# Patient Record
Sex: Male | Born: 1960 | Race: Black or African American | Hispanic: No | Marital: Single | Smoking: Former smoker
Health system: Southern US, Community
[De-identification: ages and names within clinical notes are randomized; demographics above are authoritative.]

## PROBLEM LIST (undated history)

## (undated) DIAGNOSIS — E785 Hyperlipidemia, unspecified: Secondary | ICD-10-CM

## (undated) DIAGNOSIS — I10 Essential (primary) hypertension: Secondary | ICD-10-CM

## (undated) HISTORY — PX: SHOULDER ARTHROSCOPY: SHX128

## (undated) HISTORY — PX: ABDOMINAL SURGERY: SHX537

---

## 2006-08-31 ENCOUNTER — Emergency Department (HOSPITAL_COMMUNITY): Admission: EM | Admit: 2006-08-31 | Discharge: 2006-08-31 | Payer: Self-pay | Admitting: Emergency Medicine

## 2008-06-17 ENCOUNTER — Emergency Department: Payer: Self-pay | Admitting: Emergency Medicine

## 2008-10-06 ENCOUNTER — Emergency Department: Payer: Self-pay | Admitting: Unknown Physician Specialty

## 2010-12-06 ENCOUNTER — Emergency Department: Payer: Self-pay | Admitting: Emergency Medicine

## 2012-07-26 ENCOUNTER — Emergency Department: Payer: Self-pay | Admitting: Emergency Medicine

## 2012-12-24 ENCOUNTER — Emergency Department: Payer: Self-pay | Admitting: Emergency Medicine

## 2012-12-26 ENCOUNTER — Emergency Department: Payer: Self-pay | Admitting: Emergency Medicine

## 2013-02-24 ENCOUNTER — Emergency Department: Payer: Self-pay

## 2014-01-22 ENCOUNTER — Emergency Department: Payer: Self-pay | Admitting: Internal Medicine

## 2014-01-22 LAB — BASIC METABOLIC PANEL
ANION GAP: 6 — AB (ref 7–16)
BUN: 17 mg/dL (ref 7–18)
CHLORIDE: 103 mmol/L (ref 98–107)
CO2: 28 mmol/L (ref 21–32)
Calcium, Total: 9.1 mg/dL (ref 8.5–10.1)
Creatinine: 0.99 mg/dL (ref 0.60–1.30)
EGFR (African American): >60
Glucose: 92 mg/dL (ref 65–99)
OSMOLALITY: 275 (ref 275–301)
Potassium: 4.1 mmol/L (ref 3.5–5.1)
Sodium: 137 mmol/L (ref 136–145)

## 2014-01-22 LAB — URINALYSIS, COMPLETE
BILIRUBIN, UR: NEGATIVE
Bacteria: NONE SEEN
Blood: NEGATIVE
Glucose,UR: NEGATIVE mg/dL (ref 0–75)
KETONE: NEGATIVE
LEUKOCYTE ESTERASE: NEGATIVE
NITRITE: NEGATIVE
PH: 5 (ref 4.5–8.0)
PROTEIN: NEGATIVE
RBC,UR: <1 /HPF (ref 0–5)
Specific Gravity: 1.017 (ref 1.003–1.030)
Squamous Epithelial: <1

## 2014-01-22 LAB — CBC WITH DIFFERENTIAL/PLATELET
Basophil #: 0.1 10*3/uL (ref 0.0–0.1)
Basophil %: 0.7 %
Eosinophil #: 0.2 10*3/uL (ref 0.0–0.7)
Eosinophil %: 1.7 %
HCT: 46.4 % (ref 40.0–52.0)
HGB: 15.1 g/dL (ref 13.0–18.0)
LYMPHS PCT: 21.5 %
Lymphocyte #: 2.3 10*3/uL (ref 1.0–3.6)
MCH: 30 pg (ref 26.0–34.0)
MCHC: 32.5 g/dL (ref 32.0–36.0)
MCV: 92 fL (ref 80–100)
MONO ABS: 0.7 x10 3/mm (ref 0.2–1.0)
MONOS PCT: 6.4 %
NEUTROS ABS: 7.6 10*3/uL — AB (ref 1.4–6.5)
NEUTROS PCT: 69.7 %
Platelet: 225 10*3/uL (ref 150–440)
RBC: 5.02 10*6/uL (ref 4.40–5.90)
RDW: 14.1 % (ref 11.5–14.5)
WBC: 10.9 10*3/uL — ABNORMAL HIGH (ref 3.8–10.6)

## 2014-01-22 LAB — TROPONIN I: Troponin-I: <0.02 ng/mL

## 2015-08-11 IMAGING — CT CT HEAD WITHOUT CONTRAST
1 series · 16 of 30 positions shown, 20 images · non-contrast
Comparison: None.

CLINICAL DATA: Headache for 1-1/2 weeks, high blood pressure,
tingling in the right arm

EXAM:
CT HEAD WITHOUT CONTRAST
TECHNIQUE: Contiguous axial images were obtained from the base of the skull
through the vertex without intravenous contrast.

[Series 2: head wo · axial · 0.41mm/px · z∈[+405,+535]mm · 16 of 32 slices shown, 20 images]
[im 2/32  brain]
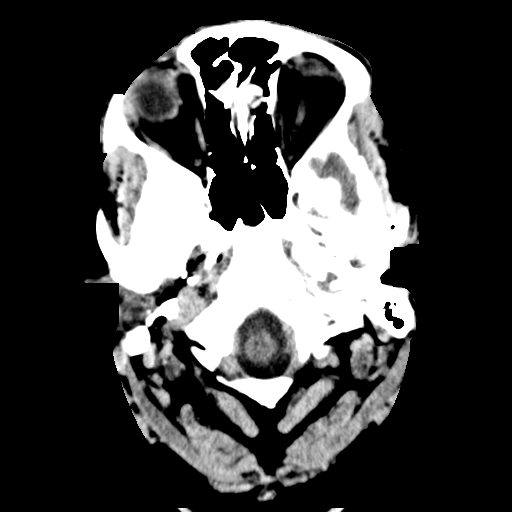
[im 2/32  bone]
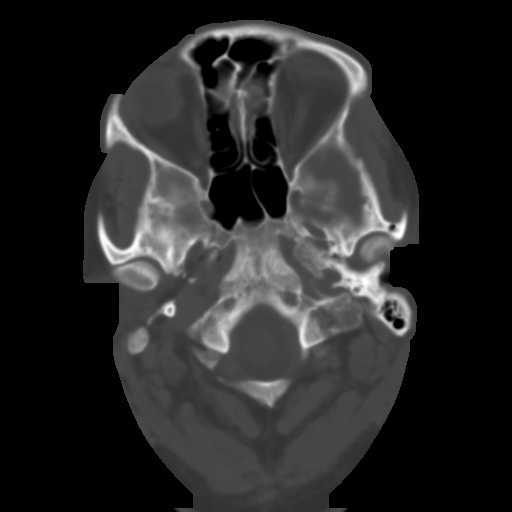
[im 4/32  brain]
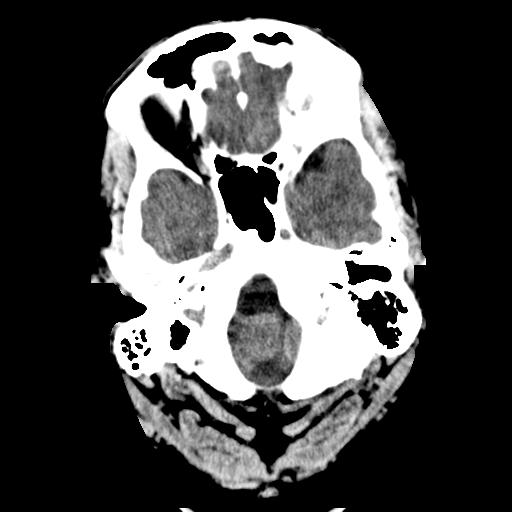
[im 6/32  brain]
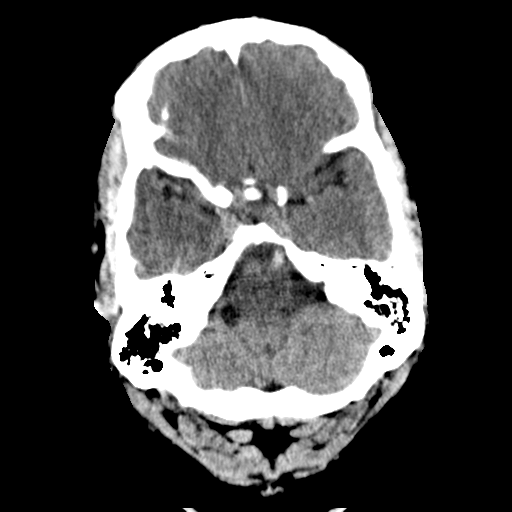
[im 8/32  brain]
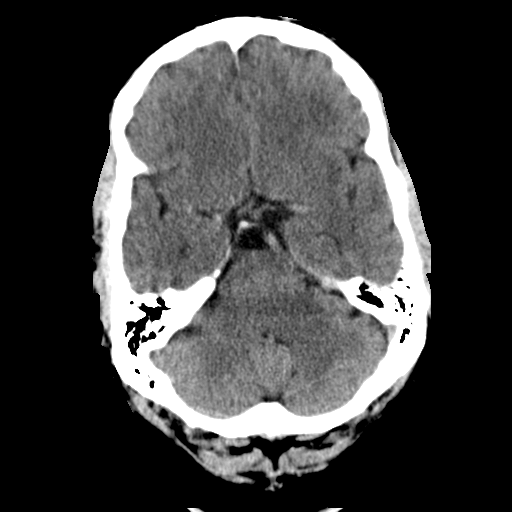
[im 9/32  brain]
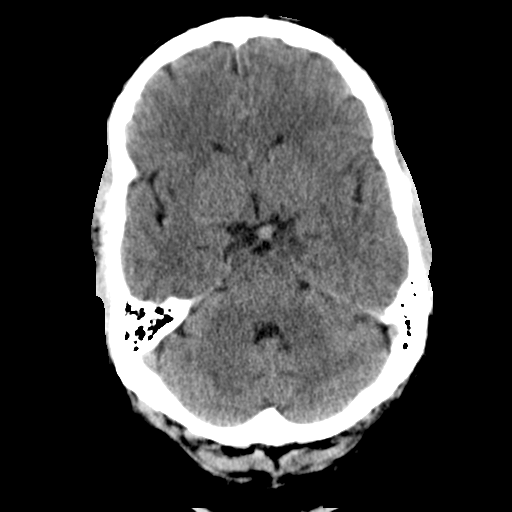
[im 9/32  bone]
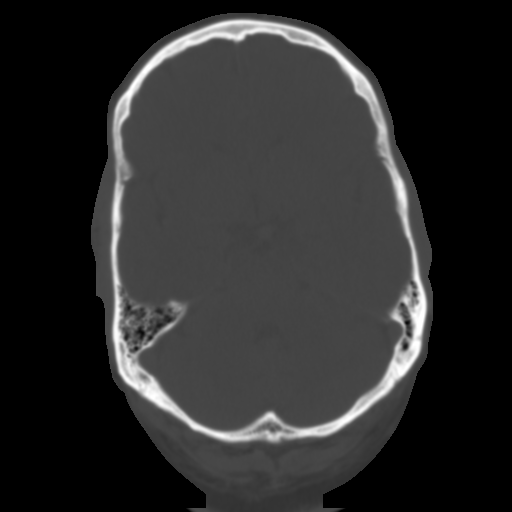
[im 11/32  brain]
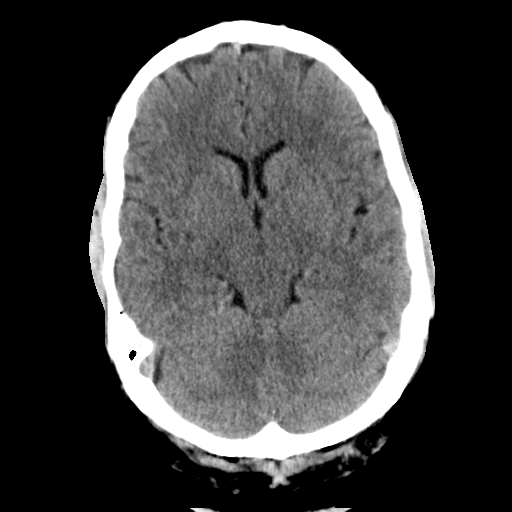
[im 13/32  brain]
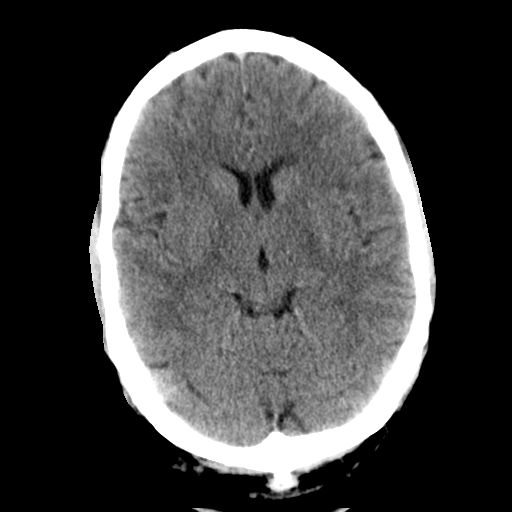
[im 15/32  brain]
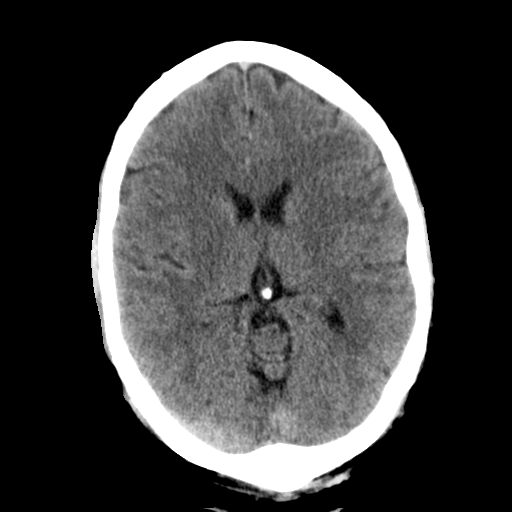
[im 17/32  brain]
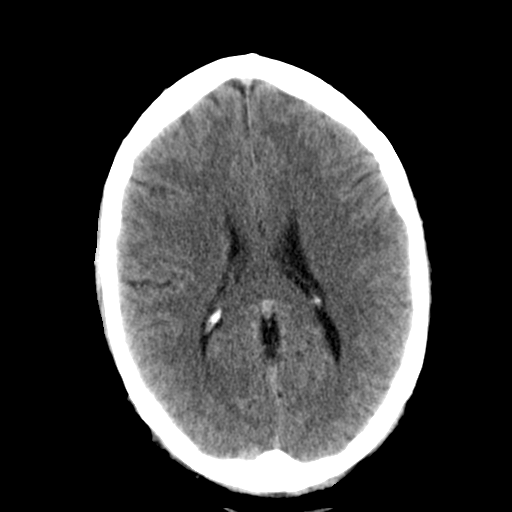
[im 17/32  bone]
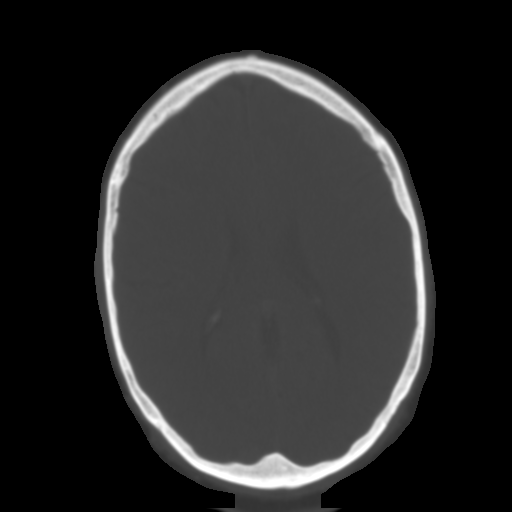
[im 19/32  brain]
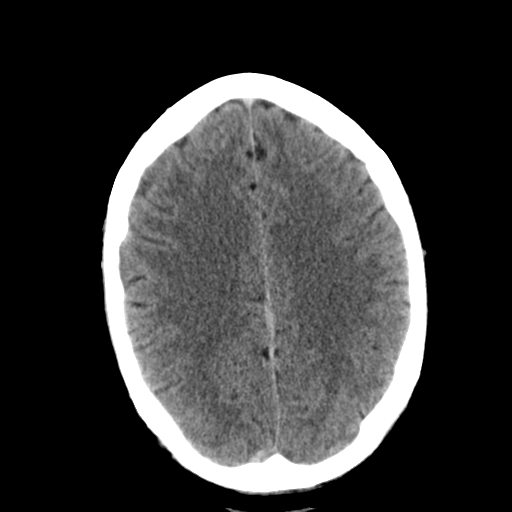
[im 21/32  brain]
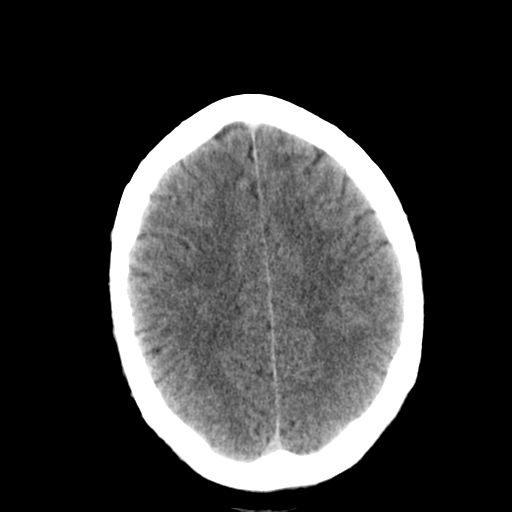
[im 23/32  brain]
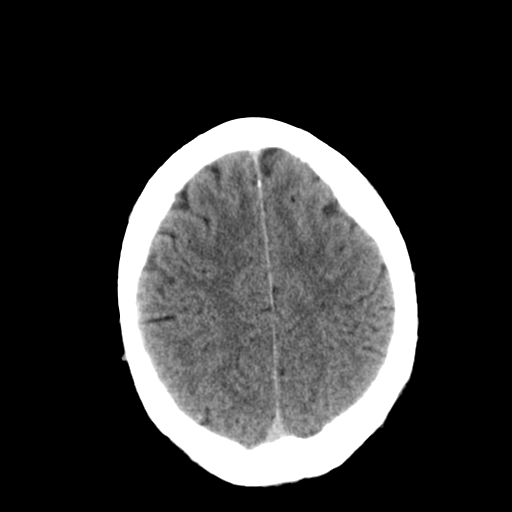
[im 24/32  brain]
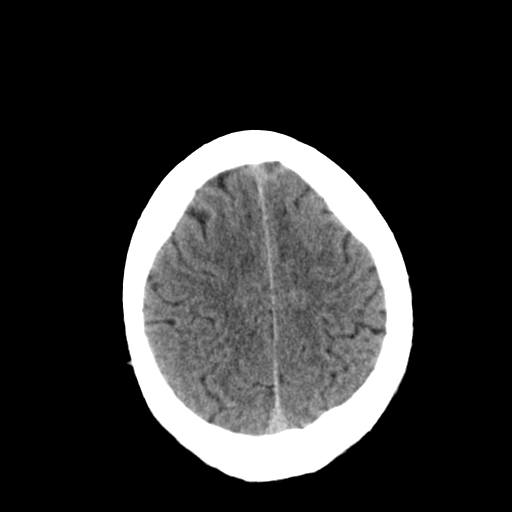
[im 24/32  bone]
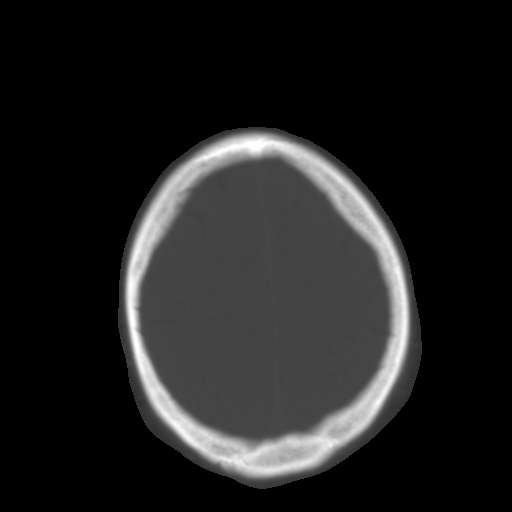
[im 26/32  brain]
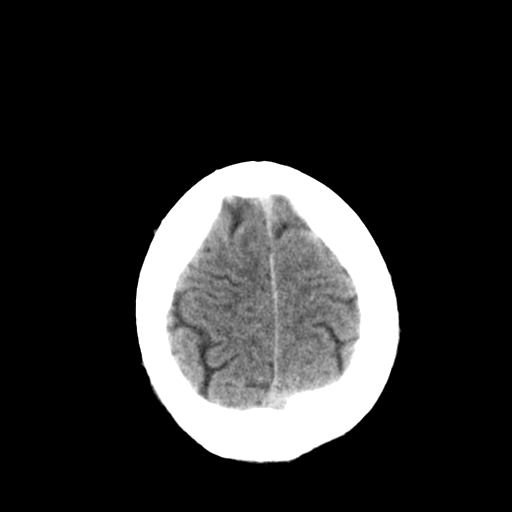
[im 28/32  brain]
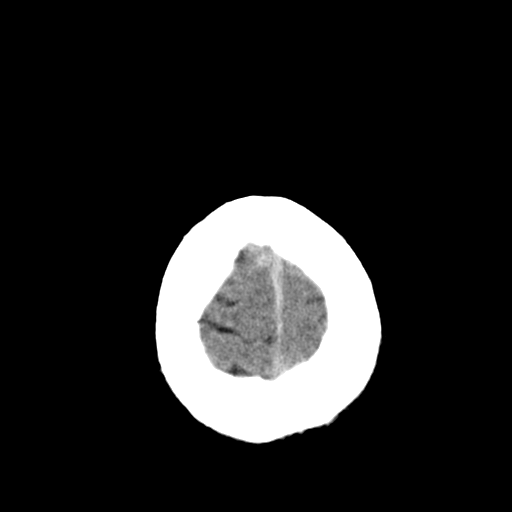
[im 30/32  brain]
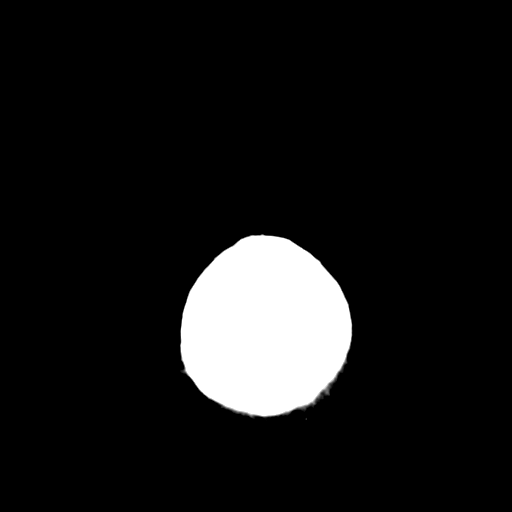

[16 of 30 positions shown; findings below may reference images not displayed]

FINDINGS: The ventricular system is normal in size and configuration, and the
septum is in a normal midline position. The fourth ventricle and
basilar cisterns are unremarkable. No hemorrhage, mass lesion, or
acute infarction is seen. On bone window images, no calvarial
abnormality is noted. The paranasal sinuses that are visualized are
clear.
IMPRESSION: Negative unenhanced CT of the brain.

## 2015-12-13 ENCOUNTER — Emergency Department
Admission: EM | Admit: 2015-12-13 | Discharge: 2015-12-13 | Disposition: A | Discharge status: Home / Self Care | Payer: Self-pay | Attending: Emergency Medicine | Admitting: Emergency Medicine

## 2015-12-13 ENCOUNTER — Encounter: Payer: Self-pay | Admitting: Emergency Medicine

## 2015-12-13 ENCOUNTER — Emergency Department: Payer: Self-pay

## 2015-12-13 DIAGNOSIS — Y9389 Activity, other specified: Secondary | ICD-10-CM | POA: Insufficient documentation

## 2015-12-13 DIAGNOSIS — Y929 Unspecified place or not applicable: Secondary | ICD-10-CM | POA: Insufficient documentation

## 2015-12-13 DIAGNOSIS — X500XXA Overexertion from strenuous movement or load, initial encounter: Secondary | ICD-10-CM | POA: Insufficient documentation

## 2015-12-13 DIAGNOSIS — S838X1A Sprain of other specified parts of right knee, initial encounter: Secondary | ICD-10-CM

## 2015-12-13 DIAGNOSIS — I10 Essential (primary) hypertension: Secondary | ICD-10-CM | POA: Insufficient documentation

## 2015-12-13 DIAGNOSIS — S83241A Other tear of medial meniscus, current injury, right knee, initial encounter: Secondary | ICD-10-CM | POA: Insufficient documentation

## 2015-12-13 DIAGNOSIS — Z87891 Personal history of nicotine dependence: Secondary | ICD-10-CM | POA: Insufficient documentation

## 2015-12-13 DIAGNOSIS — Y990 Civilian activity done for income or pay: Secondary | ICD-10-CM | POA: Insufficient documentation

## 2015-12-13 HISTORY — DX: Essential (primary) hypertension: I10

## 2015-12-13 MED ORDER — MELOXICAM 15 MG PO TABS: 15.0000 mg | ORAL_TABLET | Freq: Every day | ORAL | 0 refills | Status: DC

## 2015-12-13 MED ORDER — HYDROCODONE-ACETAMINOPHEN 5-325 MG PO TABS: 1.0000 | ORAL_TABLET | ORAL | 0 refills | Status: AC | PRN

## 2015-12-13 NOTE — ED Triage Notes (Addendum)
Pt comes in c/o right knee pain to the medial side of the knee. Reports that he has had pain in this knee for several months but has never been seen for it before. Reports that the pain is sharp in nature and radiates up and down the leg. Reports that the pain has gotten worse over the last several weeks and made it hard to sleep last night. Reports that at times it has given out on him when standing or walking. Denies ever having injured that knee either recently or in the past.

## 2015-12-13 NOTE — ED Provider Notes (Signed)
[facility] Emergency Department Provider Note ____________________________________________  Time seen: Approximately 6:51 PM  I have reviewed the triage vital signs and the nursing notes.   HISTORY  Chief Complaint Knee Pain    HPI Vincent SCAFF Sr. is a 55 y.o. male who presents to the emergency department for evaluation of knee pain. No recent or history of knee injury. 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 He states that the pain has been present for approximately one month, but has been getting worse over the past week or so. Pain was intensified last night and he was able to get much rest. He has not been taking any medications for this pain.  Past Medical History:  Diagnosis Date  . Hypertension     There are no active problems to display for this patient.   Past Surgical History:  Procedure Laterality Date  . SHOULDER ARTHROSCOPY Left     Prior to Admission medications   Medication Sig Start Date End Date Taking? Authorizing Provider  HYDROcodone-acetaminophen (NORCO/VICODIN) 5-325 MG tablet Take 1 tablet by mouth every 4 (four) hours as needed for moderate pain. 12/13/15 12/12/16  Victorino Dike, FNP  meloxicam (MOBIC) 15 MG tablet Take 1 tablet (15 mg total) by mouth daily. 12/13/15   Victorino Dike, FNP    Allergies Patient has no known allergies.  No family history on file.  Social History Social History  Substance Use Topics  . Smoking status: Former Research scientist (life sciences)  . Smokeless tobacco: Not on file  . Alcohol use No    Review of Systems Constitutional: No recent illness. Cardiovascular: Denies chest pain or palpitations. Respiratory: Denies shortness of breath. Musculoskeletal: Pain in Right knee. Negative for calf pain. Skin: Negative for rash, wound, lesion. Neurological: Negative for focal weakness or numbness.  ____________________________________________   PHYSICAL  EXAM:  VITAL SIGNS: ED Triage Vitals  Enc Vitals Group     BP 12/13/15 1825 (!) 143/99     Pulse Rate 12/13/15 1825 90     Resp 12/13/15 1825 18     Temp 12/13/15 1825 98.7 F (37.1 C)     Temp Source 12/13/15 1825 Oral     SpO2 12/13/15 1825 99 %     Weight 12/13/15 1826 240 lb (108.9 kg)     Height 12/13/15 1826 6\' 2"  (1.88 m)     Head Circumference --      Peak Flow --      Pain Score 12/13/15 1838 9     Pain Loc --      Pain Edu? --      Excl. in [place]? --     Constitutional: Alert and oriented. Well appearing and in no acute distress. Eyes: Conjunctivae are normal. EOMI. Head: Atraumatic. Neck: No stridor.  Respiratory: Normal respiratory effort.   Musculoskeletal: Pain increases with valgus stress. 5+ strength against resistance. Pain with McMurray's test, but no click or clunk felt on exam.  Neurologic:  Normal speech and language. No gross focal neurologic deficits are appreciated. Speech is normal. No gait instability. Skin:  Skin is warm, dry and intact. Atraumatic. Psychiatric: Mood and affect are normal. Speech and behavior are normal.  ____________________________________________   LABS (all labs ordered are listed, but only abnormal results are displayed)  Labs Reviewed - No data to display ____________________________________________  RADIOLOGY  Right knee negative for acute bony abnormality. ____________________________________________   PROCEDURES  Procedure(s) performed: Knee immobilizer applied by ER tech.  ____________________________________________   INITIAL IMPRESSION / ASSESSMENT AND PLAN / ED COURSE  Clinical Course     Pertinent labs & imaging results that were available during my care of the patient were reviewed by me and considered in my medical decision making (see chart for details).  Patient was advised to rest, ice, and elevate the right lower extremity as often as possible. He was advised To follow-up with the orthopedist  for symptoms that aren't improving over the week. Advised to return to emergency department for symptoms change or worsen if unable schedule an appointment. ____________________________________________   FINAL CLINICAL IMPRESSION(S) / ED DIAGNOSES  Final diagnoses:  Acute meniscal injury of knee, right, initial encounter       Victorino Dike, FNP 12/13/15 2236    Carrie Mew, MD 12/22/15 [phone]

## 2015-12-13 NOTE — ED Notes (Signed)
NAD noted at time of D/C. Pt denies questions or concerns. Pt ambulatory to the lobby at this time. Pt refused wheelchair to the lobby.  

## 2016-06-06 ENCOUNTER — Encounter: Payer: Self-pay | Admitting: *Deleted

## 2016-06-06 ENCOUNTER — Emergency Department: Payer: Self-pay

## 2016-06-06 ENCOUNTER — Emergency Department
Admission: EM | Admit: 2016-06-06 | Discharge: 2016-06-06 | Disposition: A | Discharge status: Home / Self Care | Payer: Self-pay | Attending: Emergency Medicine | Admitting: Emergency Medicine

## 2016-06-06 DIAGNOSIS — R0789 Other chest pain: Secondary | ICD-10-CM | POA: Insufficient documentation

## 2016-06-06 DIAGNOSIS — Z87891 Personal history of nicotine dependence: Secondary | ICD-10-CM | POA: Insufficient documentation

## 2016-06-06 DIAGNOSIS — R079 Chest pain, unspecified: Secondary | ICD-10-CM

## 2016-06-06 DIAGNOSIS — I10 Essential (primary) hypertension: Secondary | ICD-10-CM | POA: Insufficient documentation

## 2016-06-06 DIAGNOSIS — R600 Localized edema: Secondary | ICD-10-CM | POA: Insufficient documentation

## 2016-06-06 DIAGNOSIS — Z79899 Other long term (current) drug therapy: Secondary | ICD-10-CM | POA: Insufficient documentation

## 2016-06-06 LAB — TROPONIN I: Troponin I: <0.03 ng/mL (ref <0.03)

## 2016-06-06 LAB — BASIC METABOLIC PANEL
ANION GAP: 10 (ref 5–15)
BUN: 20 mg/dL (ref 6–20)
CHLORIDE: 103 mmol/L (ref 101–111)
CO2: 26 mmol/L (ref 22–32)
CREATININE: 1.06 mg/dL (ref 0.61–1.24)
Calcium: 8.9 mg/dL (ref 8.9–10.3)
GFR calc non Af Amer: >60 mL/min (ref >60)
Glucose, Bld: 103 mg/dL — ABNORMAL HIGH (ref 65–99)
POTASSIUM: 4.2 mmol/L (ref 3.5–5.1)
SODIUM: 139 mmol/L (ref 135–145)

## 2016-06-06 LAB — CBC
HEMATOCRIT: 41.6 % (ref 40.0–52.0)
HEMOGLOBIN: 14 g/dL (ref 13.0–18.0)
MCH: 30.1 pg (ref 26.0–34.0)
MCHC: 33.7 g/dL (ref 32.0–36.0)
MCV: 89.3 fL (ref 80.0–100.0)
PLATELETS: 221 10*3/uL (ref 150–440)
RBC: 4.66 MIL/uL (ref 4.40–5.90)
RDW: 14.1 % (ref 11.5–14.5)
WBC: 9.8 10*3/uL (ref 3.8–10.6)

## 2016-06-06 LAB — BRAIN NATRIURETIC PEPTIDE: B Natriuretic Peptide: 24 pg/mL (ref 0.0–100.0)

## 2016-06-06 MED ORDER — FUROSEMIDE 40 MG PO TABS: 40.0000 mg | ORAL_TABLET | Freq: Every day | ORAL | 0 refills | Status: AC

## 2016-06-06 NOTE — ED Triage Notes (Signed)
Pt arrived to ED reporting bilateral leg swelling x 1 month that worsened last night. Pt reports legs were numb last night as well. Left sided chest pain reported to have began 1 week ago. No radiation. No dizziness or lightheaded but pt reports increased diaphoresis while sleeping. PT reports also having SOB but reports "I have always been short of breath."   No hx of CHF.

## 2016-06-06 NOTE — ED Provider Notes (Signed)
[facility] Emergency Department Provider Note  ____________________________________________  Time seen: Approximately 4:30 PM  I have reviewed the triage vital signs and the nursing notes.   HISTORY  Chief Complaint Chest Pain and Leg Swelling    HPI Vincent LOW Sr. is a 56 y.o. male with a history of hypertension presenting with bilateral lower extremity swelling and chest pain. The patient reports that yesterday he was standing all day, and then noticed that he had bilateral symmetric swelling of the legs without any calf pain. Over the past 2 weeks, he has had intermittent episodes where he develops a sharp chest pain which lasts just a couple of seconds and goes away on its own. It is not pleuritic, does not radiate, it is not associated with shortness of breath, diaphoresis, nausea or vomiting, or palpitations. No lightheadedness or syncope. He has not had any chest pain over the past few days.    Past Medical History:  Diagnosis Date  . Hypertension     There are no active problems to display for this patient.   Past Surgical History:  Procedure Laterality Date  . SHOULDER ARTHROSCOPY Left     Current Outpatient Rx  . Order #: [id number] Class: Print  . Order #: [id number] Class: Print  . Order #: [id number] Class: Print    Allergies Patient has no known allergies.  History reviewed. No pertinent family history.  Social History Social History  Substance Use Topics  . Smoking status: Former Research scientist (life sciences)  . Smokeless tobacco: Never Used  . Alcohol use No    Review of Systems Constitutional: No fever/chills.No lightheadedness or syncope. Eyes: No visual changes. ENT: No sore throat. No congestion or rhinorrhea. Cardiovascular: Positive chest pain. Denies palpitations. Respiratory: Denies shortness of breath.  No cough. Gastrointestinal: No abdominal pain.  No nausea, no vomiting.  No diarrhea.  No constipation. Genitourinary: Negative for  dysuria. Musculoskeletal: Negative for back pain. Positive bilateral lower extremity swelling. No calf pain. Skin: Negative for rash. Neurological: Negative for headaches. No focal numbness, tingling or weakness.   10-point ROS otherwise negative.  ____________________________________________   PHYSICAL EXAM:  VITAL SIGNS: ED Triage Vitals  Enc Vitals Group     BP 06/06/16 1110 (!) 147/89     Pulse Rate 06/06/16 1110 73     Resp 06/06/16 1110 16     Temp 06/06/16 1110 98.5 F (36.9 C)     Temp Source 06/06/16 1110 Oral     SpO2 06/06/16 1110 98 %     Weight 06/06/16 1110 240 lb (108.9 kg)     Height 06/06/16 1110 6' 2.5" (1.892 m)     Head Circumference --      Peak Flow --      Pain Score 06/06/16 1109 7     Pain Loc --      Pain Edu? --      Excl. in [place]? --     Constitutional: Alert and oriented. Well appearing and in no acute distress. Answers questions appropriately. Eyes: Conjunctivae are normal.  EOMI. No scleral icterus. Head: Atraumatic. Nose: No congestion/rhinnorhea. Mouth/Throat: Mucous membranes are moist.  Neck: No stridor.  Supple.  No JVD. No meningismus. Cardiovascular: Normal rate, regular rhythm. No murmurs, rubs or gallops.  Respiratory: Normal respiratory effort.  No accessory muscle use or retractions. Lungs CTAB.  No wheezes, rales or ronchi. Gastrointestinal: Soft, nontender and nondistended.  No guarding or rebound.  No peritoneal signs. Musculoskeletal: Mild pitting symmetric LE edema to the distal  third of the tibia, without overlying erythema or warmth. No ttp in the calves or palpable cords.  Negative Homan's sign. Neurologic:  A&Ox3.  Speech is clear.  Face and smile are symmetric.  EOMI.  Moves all extremities well. Skin:  Skin is warm, dry and intact. No rash noted. Psychiatric: Mood and affect are normal. Speech and behavior are normal.  Normal judgement  ____________________________________________   LABS (all labs ordered are listed,  but only abnormal results are displayed)  Labs Reviewed  BASIC METABOLIC PANEL - Abnormal; Notable for the following:       Result Value   Glucose, Bld 103 (*)    All other components within normal limits  CBC  TROPONIN I  BRAIN NATRIURETIC PEPTIDE  TROPONIN I   ____________________________________________  EKG  ED ECG REPORT I, Eula Listen, the attending physician, personally viewed and interpreted this ECG.   Date: 06/06/2016  EKG Time: 1111  Rate: 72  Rhythm: normal sinus rhythm  Axis: normal  Intervals:none  ST&T Change: Nonspecific T-wave inversions in V1. No ST elevation  ____________________________________________  RADIOLOGY  Dg Chest 2 View  Result Date: 06/06/2016 CLINICAL DATA:  Chest pain EXAM: CHEST  2 VIEW COMPARISON:  01/22/2014 FINDINGS: Normal heart size. No pleural effusion or edema. No airspace opacities. The visualized osseous structures are unremarkable. IMPRESSION: 1. No acute cardiopulmonary abnormalities. Electronically Signed   By: Kerby Moors M.D.   On: 06/06/2016 11:33    ____________________________________________   PROCEDURES  Procedure(s) performed: None  Procedures  Critical Care performed: No ____________________________________________   INITIAL IMPRESSION / ASSESSMENT AND PLAN / ED COURSE  Pertinent labs & imaging results that were available during my care of the patient were reviewed by me and considered in my medical decision making (see chart for details).  56 y.o. male with a history of hypertension presenting with bilateral lower extremity edema after standing all day, and 2 weeks of intermittent chest pain episodes. Overall, the patient is well-appearing and has stable vital signs. He is not having any chest pain at this time. It is likely that his lower extremity edema is from age-related venous insufficiency. His creatinine is normal today, and his BNP is 24, so CHF is much less likely. He has no risk  factors for DVT, and it would be very unlikely to develop bilateral DVTs although I have given him return precautions for this, and he knows that after treatment for peripheral edema, he needs to follow up with his primary care physician for further evaluation including ultrasound if his symptoms do not resolve. For his chest pain, the patient has atypical pain with an EKG that does not show ischemic changes and multiple troponins here that are negative, and he is asymptomatic at this time. No further workup is warranted in the emergency department but I have given him the name for the cardiologist on-call to set up an outpatient stress test for risk stratification. At this time, the patient is stable for discharge. We discussed return precautions as well as follow-up instructions.  ____________________________________________  FINAL CLINICAL IMPRESSION(S) / ED DIAGNOSES  Final diagnoses:  Bilateral lower extremity edema  Chest pain, unspecified type         NEW MEDICATIONS STARTED DURING THIS VISIT:  New Prescriptions   FUROSEMIDE (LASIX) 40 MG TABLET    Take 1 tablet (40 mg total) by mouth daily.      Eula Listen, MD 06/06/16 [phone]

## 2016-06-06 NOTE — Discharge Instructions (Signed)
Please take Lasix for the next 3 days, keep your legs elevated above the level of your heart as much as possible, and use compression stockings to prevent swelling in the legs.  Make an appointment with a cardiologist for a stress test and reevaluation of your chest pain.  Return to the emergency department if he developed chest pain, shortness of breath, lightheadedness or fainting, increased swelling, fever, or any other symptoms concerning to you.

## 2016-06-07 ENCOUNTER — Telehealth: Payer: Self-pay

## 2016-06-07 NOTE — Telephone Encounter (Signed)
Tried to call patient they need an ED fu seen on 06/06/16 for CP and swelling  VM was full will try again

## 2016-06-13 NOTE — Telephone Encounter (Signed)
Tried to call patient they need an ED fu seen on 06/06/16 for CP and swelling  VM was full will try again

## 2016-06-14 NOTE — Telephone Encounter (Signed)
Tried to call patient they need an ED fu seen on 06/06/16 for CP and swelling  VM was full will try again

## 2016-06-16 NOTE — Telephone Encounter (Signed)
Sent letter

## 2017-02-13 ENCOUNTER — Other Ambulatory Visit: Payer: Self-pay

## 2017-02-13 ENCOUNTER — Encounter: Payer: Self-pay | Admitting: Gastroenterology

## 2017-02-13 ENCOUNTER — Ambulatory Visit: Payer: Self-pay | Admitting: Gastroenterology

## 2017-02-13 ENCOUNTER — Encounter (INDEPENDENT_AMBULATORY_CARE_PROVIDER_SITE_OTHER): Payer: Self-pay

## 2017-02-13 VITALS — BP 152/92 | HR 83 | Ht 74.5 in | Wt 245.2 lb

## 2017-02-13 DIAGNOSIS — K625 Hemorrhage of anus and rectum: Secondary | ICD-10-CM

## 2017-02-13 DIAGNOSIS — R195 Other fecal abnormalities: Secondary | ICD-10-CM

## 2017-02-13 NOTE — Progress Notes (Signed)
Vonda Antigua [address]  [place]  [place],  [address]  Main: [phone]  Fax: [phone]   Gastroenterology Consultation  Referring Provider:     [facility], [place] Physician:  [facility], [facility] Primary Gastroenterologist:  Dr. Vonda Antigua Reason for Consultation:     Positive FOBT        HPI:   Vincent MILHOUSE Sr. is a 57 y.o. y/o male referred for consultation & management  by Dr. Domingo Madeira, [facility].   Pt. Was found to have a positive FOBT on CRC screening done by primary care provider.  No previous history of colonoscopies.  Reports intermittent bright red blood per rectum for 1 year.  Occurs once every 2-3 months.  Occurs more often when he is straining with a bowel movement.  Reports minimal blood streaks in stool.  Reports history of hemorrhoids one year ago that were treated with suppositories and that resolved the bright red blood per rectum at that time.  No family history of colon cancer.  Patient denies any abdominal pain, diarrhea, altered bowel habits, weight loss.  Denies any dysphagia or nausea vomiting.  No alarm symptoms present.  Takes meloxicam once or twice a month.  Past Medical History:  Diagnosis Date  . Hypertension     Past Surgical History:  Procedure Laterality Date  . SHOULDER ARTHROSCOPY Left     Prior to Admission medications   Medication Sig Start Date End Date Taking? Authorizing Provider  furosemide (LASIX) 40 MG tablet Take 1 tablet (40 mg total) by mouth daily. 06/06/16 06/06/17  Eula Listen, MD  meloxicam (MOBIC) 15 MG tablet Take 1 tablet (15 mg total) by mouth daily. 12/13/15   Triplett, Dessa Phi, FNP    No family history on file.   Social History   Tobacco Use  . Smoking status: Former Research scientist (life sciences)  . Smokeless tobacco: Never Used  Substance Use Topics  . Alcohol use: No  . Drug use: No    Allergies as of 02/13/2017  . (No Known Allergies)    Review  of Systems:    All systems reviewed and negative except where noted in HPI.   Physical Exam:  There were no vitals taken for this visit. No LMP for male patient. Psych:  Alert and cooperative. Normal mood and affect. General:   Alert,  Well-developed, well-nourished, pleasant and cooperative in NAD Head:  Normocephalic and atraumatic. Eyes:  Sclera clear, no icterus.   Conjunctiva pink. Ears:  Normal auditory acuity. Nose:  No deformity, discharge, or lesions. Mouth:  No deformity or lesions,oropharynx pink & moist. Neck:  Supple; no masses or thyromegaly. Lungs:  Respirations even and unlabored.  Clear throughout to auscultation.   No wheezes, crackles, or rhonchi. No acute distress. Heart:  Regular rate and rhythm; no murmurs, clicks, rubs, or gallops. Abdomen:  Normal bowel sounds.  No bruits.  Soft, non-tender and non-distended without masses, hepatosplenomegaly or hernias noted.  No guarding or rebound tenderness.    Msk:  Symmetrical without gross deformities. Good, equal movement & strength bilaterally. Pulses:  Normal pulses noted. Extremities:  No clubbing or edema.  No cyanosis. Neurologic:  Alert and oriented x3;  grossly normal neurologically. Skin:  Intact without significant lesions or rashes. No jaundice. Lymph Nodes:  No significant cervical adenopathy. Psych:  Alert and cooperative. Normal mood and affect.   Labs: CBC    Component Value Date/Time   WBC 9.8 06/06/2016 1109   RBC 4.66  06/06/2016 1109   HGB 14.0 06/06/2016 1109   HGB 15.1 01/22/2014 1056   HCT 41.6 06/06/2016 1109   HCT 46.4 01/22/2014 1056   PLT 221 06/06/2016 1109   PLT 225 01/22/2014 1056   MCV 89.3 06/06/2016 1109   MCV 92 01/22/2014 1056   MCH 30.1 06/06/2016 1109   MCHC 33.7 06/06/2016 1109   RDW 14.1 06/06/2016 1109   RDW 14.1 01/22/2014 1056   LYMPHSABS 2.3 01/22/2014 1056   MONOABS 0.7 01/22/2014 1056   EOSABS 0.2 01/22/2014 1056   BASOSABS 0.1 01/22/2014 1056   CMP       Component Value Date/Time   NA 139 06/06/2016 1109   NA 137 01/22/2014 1056   K 4.2 06/06/2016 1109   K 4.1 01/22/2014 1056   CL 103 06/06/2016 1109   CL 103 01/22/2014 1056   CO2 26 06/06/2016 1109   CO2 28 01/22/2014 1056   GLUCOSE 103 (H) 06/06/2016 1109   GLUCOSE 92 01/22/2014 1056   BUN 20 06/06/2016 1109   BUN 17 01/22/2014 1056   CREATININE 1.06 06/06/2016 1109   CREATININE 0.99 01/22/2014 1056   CALCIUM 8.9 06/06/2016 1109   CALCIUM 9.1 01/22/2014 1056   GFRNONAA >60 06/06/2016 1109   GFRNONAA >60 01/22/2014 1056   GFRAA >60 06/06/2016 1109   GFRAA >60 01/22/2014 1056    Imaging Studies: No results found.  Assessment and Plan:   Vincent TREFRY Sr. is a 57 y.o. y/o male has been referred for positive FOBT found on CRC screening by primary care provider with intermittent bright red blood per rectum, and reported history of hemorrhoids one year ago treated with suppositories.  Patient has never had a colonoscopy We will schedule for colonoscopy due to positive FOBT I have discussed alternative options, risks & benefits,  which include, but are not limited to, bleeding, infection, perforation,respiratory complication & drug reaction.  The patient agrees with this plan & written consent will be obtained.    Intermittent bright red blood per rectum likely due to hemorrhoids, and could have caused FOBT to be positive as well High-fiber diet Maintain soft stool Can evaluate the hemorrhoids at the time of the colonoscopy as well.  Patient denies any pain in the area.  Patient educated about meloxicam use causing peptic ulcer disease.  No clinical signs or symptoms of such at this time.  Patient asked to take the medication with food and use it sparingly, and only as needed.  Dr Vonda Antigua

## 2017-02-14 ENCOUNTER — Other Ambulatory Visit: Payer: Self-pay

## 2017-02-14 DIAGNOSIS — R195 Other fecal abnormalities: Secondary | ICD-10-CM

## 2017-02-23 ENCOUNTER — Encounter
Admission: RE | Disposition: A | Discharge status: Home / Self Care | Payer: Self-pay | Source: Ambulatory Visit | Attending: Gastroenterology

## 2017-02-23 ENCOUNTER — Ambulatory Visit
Admission: RE | Admit: 2017-02-23 | Discharge: 2017-02-23 | Disposition: A | Discharge status: Home / Self Care | Payer: Self-pay | Source: Ambulatory Visit | Attending: Gastroenterology | Admitting: Gastroenterology

## 2017-02-23 ENCOUNTER — Ambulatory Visit: Payer: Self-pay | Admitting: Anesthesiology

## 2017-02-23 ENCOUNTER — Ambulatory Visit: Admit: 2017-02-23 | Payer: Self-pay | Admitting: Gastroenterology

## 2017-02-23 ENCOUNTER — Encounter: Payer: Self-pay | Admitting: *Deleted

## 2017-02-23 DIAGNOSIS — K635 Polyp of colon: Secondary | ICD-10-CM

## 2017-02-23 DIAGNOSIS — Z791 Long term (current) use of non-steroidal anti-inflammatories (NSAID): Secondary | ICD-10-CM | POA: Insufficient documentation

## 2017-02-23 DIAGNOSIS — Z79899 Other long term (current) drug therapy: Secondary | ICD-10-CM | POA: Insufficient documentation

## 2017-02-23 DIAGNOSIS — D120 Benign neoplasm of cecum: Secondary | ICD-10-CM

## 2017-02-23 DIAGNOSIS — R195 Other fecal abnormalities: Secondary | ICD-10-CM

## 2017-02-23 DIAGNOSIS — D125 Benign neoplasm of sigmoid colon: Secondary | ICD-10-CM

## 2017-02-23 DIAGNOSIS — K648 Other hemorrhoids: Secondary | ICD-10-CM | POA: Insufficient documentation

## 2017-02-23 DIAGNOSIS — I10 Essential (primary) hypertension: Secondary | ICD-10-CM | POA: Insufficient documentation

## 2017-02-23 DIAGNOSIS — D374 Neoplasm of uncertain behavior of colon: Secondary | ICD-10-CM | POA: Insufficient documentation

## 2017-02-23 DIAGNOSIS — Z87891 Personal history of nicotine dependence: Secondary | ICD-10-CM | POA: Insufficient documentation

## 2017-02-23 DIAGNOSIS — K921 Melena: Secondary | ICD-10-CM

## 2017-02-23 HISTORY — PX: COLONOSCOPY WITH PROPOFOL: SHX5780

## 2017-02-23 SURGERY — COLONOSCOPY WITH PROPOFOL
Anesthesia: Choice

## 2017-02-23 SURGERY — COLONOSCOPY WITH PROPOFOL
Anesthesia: General

## 2017-02-23 MED ORDER — FENTANYL CITRATE (PF) 100 MCG/2ML IJ SOLN
INTRAMUSCULAR | Status: DC | PRN
  Administered 2017-02-23 (×2): 50 ug via INTRAVENOUS

## 2017-02-23 MED ORDER — FENTANYL CITRATE (PF) 100 MCG/2ML IJ SOLN
INTRAMUSCULAR | Status: AC
  Filled 2017-02-23: qty 2

## 2017-02-23 MED ORDER — PROPOFOL 500 MG/50ML IV EMUL
INTRAVENOUS | Status: DC | PRN
  Administered 2017-02-23: 160 ug/kg/min via INTRAVENOUS

## 2017-02-23 MED ORDER — SPOT INK MARKER SYRINGE KIT
PACK | SUBMUCOSAL | Status: DC | PRN
  Administered 2017-02-23: 10 mL via SUBMUCOSAL

## 2017-02-23 MED ORDER — SODIUM CHLORIDE 0.9 % IV SOLN
INTRAVENOUS | Status: DC
  Administered 2017-02-23 (×2): via INTRAVENOUS

## 2017-02-23 MED ORDER — PROPOFOL 500 MG/50ML IV EMUL: INTRAVENOUS | Status: DC | PRN

## 2017-02-23 MED ORDER — PROPOFOL 500 MG/50ML IV EMUL
INTRAVENOUS | Status: AC
  Filled 2017-02-23: qty 50

## 2017-02-23 MED ORDER — LIDOCAINE 2% (20 MG/ML) 5 ML SYRINGE
INTRAMUSCULAR | Status: DC | PRN
  Administered 2017-02-23: 40 mg via INTRAVENOUS

## 2017-02-23 MED ORDER — PROPOFOL 10 MG/ML IV BOLUS
INTRAVENOUS | Status: DC | PRN
  Administered 2017-02-23: 100 mg via INTRAVENOUS

## 2017-02-23 NOTE — H&P (Signed)
  Vonda Antigua, MD [address], [place], [place], [place], [address] [address], [place], [place], [place], [address] Phone: [phone]  Fax: [phone]  Primary Care Physician:  [facility], [place]   Pre-Procedure History & Physical: HPI:  Vincent FAWCETT Sr. is a 57 y.o. male is here for a colonoscopy.   Past Medical History:  Diagnosis Date  . Hypertension   . Hypertension     Past Surgical History:  Procedure Laterality Date  . SHOULDER ARTHROSCOPY Left     Prior to Admission medications   Medication Sig Start Date End Date Taking? Authorizing Provider  furosemide (LASIX) 40 MG tablet Take 1 tablet (40 mg total) by mouth daily. 06/06/16 06/06/17  Eula Listen, MD  meloxicam (MOBIC) 15 MG tablet Take 1 tablet (15 mg total) by mouth daily. 12/13/15   Sherrie George B, FNP    Allergies as of 02/14/2017  . (No Known Allergies)    Family History  Problem Relation Age of Onset  . Hypertension Mother   . Cancer Father        does not know what kind    Social History   Socioeconomic History  . Marital status: Single    Spouse name: Not on file  . Number of children: Not on file  . Years of education: Not on file  . Highest education level: Not on file  Social Needs  . Financial resource strain: Not on file  . Food insecurity - worry: Not on file  . Food insecurity - inability: Not on file  . Transportation needs - medical: Not on file  . Transportation needs - non-medical: Not on file  Occupational History  . Not on file  Tobacco Use  . Smoking status: Former Research scientist (life sciences)  . Smokeless tobacco: Never Used  Substance and Sexual Activity  . Alcohol use: No  . Drug use: No  . Sexual activity: Yes  Other Topics Concern  . Not on file  Social History Narrative  . Not on file    Review of Systems: See HPI, otherwise negative ROS  Physical Exam: There were no vitals taken for this visit. General:   Alert,  pleasant and  cooperative in NAD Head:  Normocephalic and atraumatic. Neck:  Supple; no masses or thyromegaly. Lungs:  Clear throughout to auscultation, normal respiratory effort.    Heart:  +S1, +S2, Regular rate and rhythm, No edema. Abdomen:  Soft, nontender and nondistended. Normal bowel sounds, without guarding, and without rebound.   Neurologic:  Alert and  oriented x4;  grossly normal neurologically.  Impression/Plan: Epifania Gore Yorio Sr. is here for a colonoscopy to be performed for positive FOBT  Risks, benefits, limitations, and alternatives regarding  colonoscopy have been reviewed with the patient.  Questions have been answered.  All parties agreeable.   Virgel Manifold, MD  02/23/2017, 8:51 AM

## 2017-02-23 NOTE — Anesthesia Postprocedure Evaluation (Signed)
Anesthesia Post Note  Patient: Vincent Desantis Hirschmann Sr.  Procedure(s) Performed: COLONOSCOPY WITH PROPOFOL (N/A )  Patient location during evaluation: Endoscopy Anesthesia Type: General Level of consciousness: awake and alert Pain management: pain level controlled Vital Signs Assessment: post-procedure vital signs reviewed and stable Respiratory status: spontaneous breathing, nonlabored ventilation, respiratory function stable and patient connected to nasal cannula oxygen Cardiovascular status: blood pressure returned to baseline and stable Postop Assessment: no apparent nausea or vomiting Anesthetic complications: no     Last Vitals:  Vitals:   02/23/17 1133 02/23/17 1143  BP: (!) 148/105 (!) 148/98  Pulse: 62 63  Resp: 14 14  Temp:    SpO2: 96% 96%    Last Pain:  Vitals:   02/23/17 1143  TempSrc:   PainSc: 0-No pain                 Martha Clan

## 2017-02-23 NOTE — Op Note (Signed)
[facility] Gastroenterology Patient Name: Vincent Greene Procedure Date: 02/23/2017 9:54 AM MRN: [id number] Account #: [id number] Date of Birth: 1961/01/18 Admit Type: Outpatient Age: 57 Room: [facility] ENDO ROOM 3 Gender: Male Note Status: Finalized Procedure:            Colonoscopy Indications:          Hematochezia, Heme positive stool Providers:            Aslynn Brunetti B. Bonna Gains MD, MD Referring MD:         [place], MD (Referring MD) Medicines:            Monitored Anesthesia Care Complications:        No immediate complications. Procedure:            Pre-Anesthesia Assessment:                       - ASA Grade Assessment: II - A patient with mild                        systemic disease.                       - Prior to the procedure, a History and Physical was                        performed, and patient medications, allergies and                        sensitivities were reviewed. The patient's tolerance of                        previous anesthesia was reviewed.                       - The risks and benefits of the procedure and the                        sedation options and risks were discussed with the                        patient. All questions were answered and informed                        consent was obtained.                       - Patient identification and proposed procedure were                        verified prior to the procedure by the physician, the                        nurse, the anesthesiologist, the anesthetist and the                        technician. The procedure was verified in the procedure                        room.  After obtaining informed consent, the colonoscope was                        passed under direct vision. Throughout the procedure,                        the patient's blood pressure, pulse, and oxygen                        saturations were monitored  continuously. The                        Colonoscope was introduced through the anus and                        advanced to the the terminal ileum. The colonoscopy was                        performed with ease. The patient tolerated the                        procedure well. The quality of the bowel preparation                        was good. Findings:      The perianal and digital rectal examinations were normal.      The terminal ileum appeared normal.      A 6 mm polyp was found in the cecum. The polyp was sessile. The polyp       was removed with a cold snare. Resection and retrieval were complete.      The ileocecal valve contained an area of normal mucosa vascular pattern,       but was slightly thickened. Biopsies were taken with a cold forceps for       histology.      A 2 mm polyp was found in the transverse colon. The polyp was sessile.       The polyp was removed with a cold biopsy forceps. Resection and       retrieval were complete.      A 40 mm polyp was found in the sigmoid colon at 55cm. The polyp was       multi-lobulated. Area was unsuccessfully injected with saline for lesion       assessment, but the lesion could not be lifted adequately (The back of       the polyp did not lift at all. The front of the polyp lifted but the       center of the polyp did not lift. This was not adequate for complete       visualization and resection). Biopsies were taken with a cold forceps       for histology (Biopsies were taken from the top edges and the top center       of the polyp). Area was successfully injected with [place] ink for       tattooing.      A 5 mm polyp was found in the sigmoid colon. The polyp was       semi-pedunculated. The polyp was removed with a cold snare. Resection       and retrieval were complete.      Internal Hemorrhoids      The  exam was otherwise without abnormality. Impression:           - The examined portion of the ileum was normal.                        - One 6 mm polyp in the cecum, removed with a cold                        snare. Resected and retrieved.                       - Normal mucosa vascular pattern at the ileocecal                        valve. Biopsied.                       - One 2 mm polyp in the transverse colon, removed with                        a cold biopsy forceps. Resected and retrieved.                       - One 40 mm polyp in the sigmoid colon. Treatment not                        successful. Biopsied. Injected.                       - One 5 mm polyp in the sigmoid colon, removed with a                        cold snare. Resected and retrieved.                       - Internal hemorrhoids.                       - The examination was otherwise normal. Recommendation:       - Discharge patient to home (with escort).                       - Advance diet as tolerated.                       - Continue present medications.                       - Await pathology results.                       - Repeat colonoscopy date to be determined after                        pending pathology results are reviewed for surveillance.                       - Refer to a colo-rectal surgeon at the next available                        appointment.                       -  The findings and recommendations were discussed with                        the patient.                       - The findings and recommendations were discussed with                        the patient's family.                       - Return to primary care physician as previously                        scheduled.                       - Return to my office in 2 weeks. Procedure Code(s):    --- Professional ---                       [phone], Colonoscopy, flexible; with removal of tumor(s),                        polyp(s), or other lesion(s) by snare technique                       45380, 85, Colonoscopy, flexible; with biopsy, single                        or  multiple                       45381, Colonoscopy, flexible; with directed submucosal                        injection(s), any substance Diagnosis Code(s):    --- Professional ---                       K64.8, Other hemorrhoids                       D12.0, Benign neoplasm of cecum                       D12.3, Benign neoplasm of transverse colon (hepatic                        flexure or splenic flexure)                       D12.5, Benign neoplasm of sigmoid colon                       K92.1, Melena (includes Hematochezia)                       R19.5, Other fecal abnormalities CPT copyright 2016 American Medical Association. All rights reserved. The codes documented in this report are preliminary and upon coder review may  be revised to meet current compliance requirements.  Vonda Antigua, MD Margretta Sidle B. Bonna Gains MD, MD 02/23/2017 11:23:38 AM This report has been signed electronically. Number of Addenda: 0 Note Initiated On: 02/23/2017  9:54 AM Scope Withdrawal Time: 0 hours 51 minutes 38 seconds  Total Procedure Duration: 0 hours 56 minutes 33 seconds  Estimated Blood Loss: Estimated blood loss: none.      [facility]

## 2017-02-23 NOTE — Anesthesia Preprocedure Evaluation (Signed)
Anesthesia Evaluation  Patient identified by MRN, date of birth, ID band Patient awake    Reviewed: Allergy & Precautions, H&P , NPO status , Patient's Chart, lab work & pertinent test results, reviewed documented beta blocker date and time   History of Anesthesia Complications Negative for: history of anesthetic complications  Airway Mallampati: III  TM Distance: >3 FB Neck ROM: full    Dental  (+) Dental Advidsory Given, Missing   Pulmonary neg pulmonary ROS, former smoker,           Cardiovascular Exercise Tolerance: Good hypertension, (-) angina(-) CAD, (-) Past MI, (-) Cardiac Stents and (-) CABG (-) dysrhythmias (-) Valvular Problems/Murmurs     Neuro/Psych negative neurological ROS  negative psych ROS   GI/Hepatic negative GI ROS, Neg liver ROS,   Endo/Other  negative endocrine ROS  Renal/GU negative Renal ROS  negative genitourinary   Musculoskeletal   Abdominal   Peds  Hematology negative hematology ROS (+)   Anesthesia Other Findings Past Medical History: No date: Hypertension No date: Hypertension   Reproductive/Obstetrics negative OB ROS                             Anesthesia Physical Anesthesia Plan  ASA: II  Anesthesia Plan: General   Post-op Pain Management:    Induction: Intravenous  PONV Risk Score and Plan: 2 and Propofol infusion  Airway Management Planned: Nasal Cannula  Additional Equipment:   Intra-op Plan:   Post-operative Plan:   Informed Consent: I have reviewed the patients History and Physical, chart, labs and discussed the procedure including the risks, benefits and alternatives for the proposed anesthesia with the patient or authorized representative who has indicated his/her understanding and acceptance.   Dental Advisory Given  Plan Discussed with: Anesthesiologist, CRNA and Surgeon  Anesthesia Plan Comments:         Anesthesia  Quick Evaluation

## 2017-02-23 NOTE — Anesthesia Post-op Follow-up Note (Signed)
Anesthesia QCDR form completed.        

## 2017-02-23 NOTE — Transfer of Care (Signed)
Immediate Anesthesia Transfer of Care Note  Patient: Vincent Kreeger Traum Sr.  Procedure(s) Performed: COLONOSCOPY WITH PROPOFOL (N/A )  Patient Location: PACU and Endoscopy Unit  Anesthesia Type:General  Level of Consciousness: sedated  Airway & Oxygen Therapy: Patient Spontanous Breathing and Patient connected to nasal cannula oxygen  Post-op Assessment: Report given to RN and Post -op Vital signs reviewed and stable  Post vital signs: Reviewed and stable  Last Vitals:  Vitals:   02/23/17 0905 02/23/17 1113  BP: (!) 163/109 110/71  Pulse: 74 62  Resp: 16 14  Temp: (!) 36.4 C (!) 36.3 C  SpO2: 100% 100%    Last Pain:  Vitals:   02/23/17 1113  TempSrc:   PainSc: Asleep         Complications: No apparent anesthesia complications

## 2017-02-27 LAB — SURGICAL PATHOLOGY

## 2017-03-01 ENCOUNTER — Telehealth: Payer: Self-pay

## 2017-03-01 DIAGNOSIS — D126 Benign neoplasm of colon, unspecified: Secondary | ICD-10-CM

## 2017-03-01 DIAGNOSIS — D122 Benign neoplasm of ascending colon: Secondary | ICD-10-CM

## 2017-03-01 DIAGNOSIS — K635 Polyp of colon: Secondary | ICD-10-CM

## 2017-03-01 NOTE — Telephone Encounter (Signed)
LVM for patient callback for results per Dr. Bonna Gains  - his biopsy results showed that the polyp that could not be removed but was biopsied is a Villous Adenoma. This means it is a precancerous polyp, and needs to be removed.  Please refer him to surgery as this is too big to be removed with colonoscopy.   3 other polyps were seen and removed from his colon and these were benign but precancerous polyps   Please have him follow up with me in clinic in 2-3 weeks.   - prepared referral to be sent to Dr. Leighton Ruff for polyp removal.  Referral will be sent after patient has been contacted.

## 2017-03-08 ENCOUNTER — Ambulatory Visit: Payer: Self-pay | Admitting: Surgery

## 2017-03-08 NOTE — H&P (Signed)
Vincent Greene Documented: 03/08/2017 3:02 PM Location: [place] Office Patient #: [id number] DOB: [birth year] Single / Language: Cleophus Molt / Race: Black or African American Male  History of Present Illness Adin Hector MD; 03/08/2017 3:38 PM) The patient is a 57 year old male who presents with a colonic polyp. Note for "Colonic polyp": ` ` ` Patient sent for surgical consultation at the request of Dr. Vonda Antigua, GI  Chief Complaint: sigmoid colon 4 cm mass consistent with villous adenoma. Unresectable by colonoscopy.  The patient is a patient had the [facility] in [place]. Was found to have a positive fecal occult blood test. Underwent colonoscopy. Numerous polyps removed. Tubular adenoma noted in cecum, transverse, and in sigmoid colon. A more bulky mass about 4 cm in size noted at sigmoid colon, about 50 cm from the anal verge. about at least a third of the circumference. Saline left attempted but not clean. Therefore not felt to be amenable to polypectomy. Biopsy consistent with villous adenoma. Tattooed. Surgical consultation requested for removal. he comes in today by himself. He is uninsured. he normally moves his bowels every day. No bowel bouts of constipation and diarrhea. No prior abdominal surgeries. He is not smoke tobacco in 30 years. Can walk at least a half hour without much difficulty. Some mild shortness of breath but nothing severe. His older brother had an underwhelming colonoscopy. He does not know of any family history of any colorectal problems.  No personal nor family history of GI/colon cancer, inflammatory bowel disease, irritable bowel syndrome, allergy such as Celiac Sprue, dietary/dairy problems, colitis, ulcers nor gastritis. No recent sick contacts/gastroenteritis. No travel outside the country. No changes in diet. No dysphagia to solids or liquids. No significant heartburn or reflux. No hematochezia, hematemesis, coffee  ground emesis. No evidence of prior gastric/peptic ulceration.  (Review of systems as stated in this history (HPI) or in the review of systems. Otherwise all other 12 point ROS are negative)   Problem List/Past Medical Adin Hector, MD; 03/08/2017 3:33 PM) High blood pressure  Allergies (April Staton, CMA; 03/08/2017 3:03 PM) No Known Drug Allergies [03/08/2017]:  Medication History (April Staton, CMA; 03/08/2017 3:05 PM) AmLODIPine Besylate (10MG  Tablet, Oral) Active. Medications Reconciled     Review of Systems (April Staton CMA; 03/08/2017 3:02 PM) General Present- Night Sweats. Not Present- Appetite Loss, Chills, Fatigue, Fever, Weight Gain and Weight Loss. Skin Not Present- Change in Wart/Mole, Dryness, Hives, Jaundice, New Lesions, Non-Healing Wounds, Rash and Ulcer. HEENT Present- Wears glasses/contact lenses. Not Present- Earache, Hearing Loss, Hoarseness, Nose Bleed, Oral Ulcers, Ringing in the Ears, Seasonal Allergies, Sinus Pain, Sore Throat, Visual Disturbances and Yellow Eyes. Cardiovascular Present- Chest Pain. Not Present- Difficulty Breathing Lying Down, Leg Cramps, Palpitations, Rapid Heart Rate, Shortness of Breath and Swelling of Extremities. Gastrointestinal Present- Bloody Stool and Hemorrhoids. Not Present- Abdominal Pain, Bloating, Change in Bowel Habits, Chronic diarrhea, Constipation, Difficulty Swallowing, Excessive gas, Gets full quickly at meals, Indigestion, Nausea, Rectal Pain and Vomiting. Male Genitourinary Not Present- Blood in Urine, Change in Urinary Stream, Frequency, Impotence, Nocturia, Painful Urination, Urgency and Urine Leakage. Musculoskeletal Present- Joint Pain. Not Present- Back Pain, Joint Stiffness, Muscle Pain, Muscle Weakness and Swelling of Extremities. Neurological Present- Numbness and Tingling. Not Present- Decreased Memory, Fainting, Headaches, Seizures, Tremor, Trouble walking and Weakness. Psychiatric Not Present- Anxiety,  Bipolar, Change in Sleep Pattern, Depression, Fearful and Frequent crying. Hematology Not Present- Blood Thinners, Easy Bruising, Excessive bleeding, Gland problems, HIV and Persistent Infections.  Vitals (  April Staton CMA; 03/08/2017 3:06 PM) 03/08/2017 3:05 PM Weight: 238.25 lb Height: 74.5in Body Surface Area: 2.35 m Body Mass Index: 30.18 kg/m  Temp.: 97.12F(Oral)  Pulse: 83 (Regular)  BP: 150/100 (Sitting, Left Arm, Standard)      Physical Exam Adin Hector MD; 03/08/2017 3:36 PM)  General Mental Status-Alert. General Appearance-Not in acute distress, Not Sickly. Orientation-Oriented X3. Hydration-Well hydrated. Voice-Normal.  Integumentary Global Assessment Upon inspection and palpation of skin surfaces of the - Axillae: non-tender, no inflammation or ulceration, no drainage. and Distribution of scalp and body hair is normal. General Characteristics Temperature - normal warmth is noted.  Head and Neck Head-normocephalic, atraumatic with no lesions or palpable masses. Face Global Assessment - atraumatic, no absence of expression. Neck Global Assessment - no abnormal movements, no bruit auscultated on the right, no bruit auscultated on the left, no decreased range of motion, non-tender. Trachea-midline. Thyroid Gland Characteristics - non-tender.  Eye Eyeball - Left-Extraocular movements intact, No Nystagmus. Eyeball - Right-Extraocular movements intact, No Nystagmus. Cornea - Left-No Hazy. Cornea - Right-No Hazy. Sclera/Conjunctiva - Left-No scleral icterus, No Discharge. Sclera/Conjunctiva - Right-No scleral icterus, No Discharge. Pupil - Left-Direct reaction to light normal. Pupil - Right-Direct reaction to light normal. Note: Wears glasses. Vision corrected  ENMT Ears Pinna - Left - no drainage observed, no generalized tenderness observed. Right - no drainage observed, no generalized tenderness observed. Nose  and Sinuses External Inspection of the Nose - no destructive lesion observed. Inspection of the nares - Left - quiet respiration. Right - quiet respiration. Mouth and Throat Lips - Upper Lip - no fissures observed, no pallor noted. Lower Lip - no fissures observed, no pallor noted. Nasopharynx - no discharge present. Oral Cavity/Oropharynx - Tongue - no dryness observed. Oral Mucosa - no cyanosis observed. Hypopharynx - no evidence of airway distress observed.  Chest and Lung Exam Inspection Movements - Normal and Symmetrical. Accessory muscles - No use of accessory muscles in breathing. Palpation Palpation of the chest reveals - Non-tender. Auscultation Breath sounds - Normal and Clear.  Cardiovascular Auscultation Rhythm - Regular. Murmurs & Other Heart Sounds - Auscultation of the heart reveals - No Murmurs and No Systolic Clicks.  Abdomen Inspection Inspection of the abdomen reveals - No Visible peristalsis and No Abnormal pulsations. Umbilicus - No Bleeding, No Urine drainage. Palpation/Percussion Palpation and Percussion of the abdomen reveal - Soft, Non Tender, No Rebound tenderness, No Rigidity (guarding) and No Cutaneous hyperesthesia. Note: Abdomen soft. Nontender. Not distended. No umbilical or incisional hernias. No guarding.  Male Genitourinary Sexual Maturity Tanner 5 - Adult hair pattern and Adult penile size and shape. Note: No inguinal hernias. Normal external genitalia. Epididymi, testes, and spermatic cords normal without any masses.  Peripheral Vascular Upper Extremity Inspection - Left - No Cyanotic nailbeds, Not Ischemic. Right - No Cyanotic nailbeds, Not Ischemic.  Neurologic Neurologic evaluation reveals -normal attention span and ability to concentrate, able to name objects and repeat phrases. Appropriate fund of knowledge , normal sensation and normal coordination. Mental Status Affect - not angry, not paranoid. Cranial Nerves-Normal  Bilaterally. Gait-Normal.  Neuropsychiatric Mental status exam performed with findings of-able to articulate well with normal speech/language, rate, volume and coherence, thought content normal with ability to perform basic computations and apply abstract reasoning and no evidence of hallucinations, delusions, obsessions or homicidal/suicidal ideation.  Musculoskeletal Global Assessment Spine, Ribs and Pelvis - no instability, subluxation or laxity. Right Upper Extremity - no instability, subluxation or laxity.  Lymphatic Head & Neck  General Head & Neck Lymphatics: Bilateral - Description - No Localized lymphadenopathy. Axillary  General Axillary Region: Bilateral - Description - No Localized lymphadenopathy. Femoral & Inguinal  Generalized Femoral & Inguinal Lymphatics: Left - Description - No Localized lymphadenopathy. Right - Description - No Localized lymphadenopathy.    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 All but one removed.  4 cm polyp in sigmoid colon unresectable and not able to be saline lifted. Pathology consistent with villous adenoma. I recommended segmental resection to remove that section of colon with it in case there is tumor for diagnostic and therapeutic intervention. Reasonable minimally invasive approach. He is interested in proceeding.  Hopefully can talk to financial advisor's to figure out a plan to help since he is uninsured and sulfate this time.  I strongly recommend he makes sure that family >68 y/o have had colonoscopies. He knows his older brother has already had one.   PREOP COLON - ENCOUNTER FOR PREOPERATIVE EXAMINATION FOR GENERAL SURGICAL PROCEDURE (Z01.818)  Current Plans You are being scheduled for surgery- Our schedulers will call you.  You should hear from our  office's scheduling department within 5 working days about the location, date, and time of surgery. We try to make accommodations for patient's preferences in scheduling surgery, but sometimes the OR schedule or the surgeon's schedule prevents [place] from making those accommodations.  If you have not heard from our office [phone]) in 5 working days, call the office and ask for your surgeon's nurse.  If you have other questions about your diagnosis, plan, or surgery, call the office and ask for your surgeon's nurse.  Written instructions provided The anatomy & physiology of the digestive tract was discussed. The pathophysiology of the colon was discussed. Natural history risks without surgery was discussed. I feel the risks of no intervention will lead to serious problems that outweigh the operative risks; therefore, I recommended a partial colectomy to remove the pathology. Minimally invasive (Robotic/Laparoscopic) & open techniques were discussed.  Risks such as bleeding, infection, abscess, leak, reoperation, possible ostomy, hernia, heart attack, death, and other risks were discussed. I noted a good likelihood this will help address the problem. Goals of post-operative recovery were discussed as well. Need for adequate nutrition, daily bowel regimen and healthy physical activity, to optimize recovery was noted as well. We will work to minimize complications. Educational materials were available as well. Questions were answered. The patient expresses understanding & wishes to proceed with surgery.  Pt Education - [facility] Colon Bowel Prep 2018 ERAS/Miralax/Antibiotics Started Neomycin Sulfate 500 MG Oral Tablet, 2 (two) Tablet SEE NOTE, #6, 03/08/2017, No Refill. Local Order: TAKE TWO TABLETS AT 2 PM, 3 PM, AND 10 PM THE DAY PRIOR TO SURGERY Started Flagyl 500 MG Oral Tablet, 2 (two) Tablet SEE NOTE, #6, 03/08/2017, No Refill. Local Order: Take at 2pm, 3pm, and 10pm the day prior to your  colon operation Pt Education - Pamphlet Given - Laparoscopic Colorectal Surgery: discussed with patient and provided information. Pt Education - [facility] Colectomy post-op instructions: discussed with patient and provided information.  HISTORY OF COLON POLYPS (Z86.010)  Current Plans Consider follow up colonoscopy by your gastroenterologist. Since it was a benign pre-cancerous polyp that was removed by colon resection, consider follow-up colonoscopy in about 3 years. Call your gastroenterologist for advice your family has an increased risk for colon polyps and  cancers. Recommended that parents and brother/sisters have had colonoscopies by now  Consider follow up colonoscopy by your gastroenterologist.  Since you had a colorectal cancer resected by surgery, you should strongly consider getting a colonoscopy by your gastroenterologict in one year after the surgery that removed your cancer. Call your gastroenterologist for advice  Adin Hector, M.D., F.A.C.S. Gastrointestinal and Minimally Invasive Surgery [facility] [address] [place], [place] [address] [phone] Main / Paging

## 2017-05-09 ENCOUNTER — Telehealth: Payer: Self-pay

## 2017-05-09 NOTE — Telephone Encounter (Signed)
-----   Message from Virgel Manifold, MD sent at 05/04/2017  9:35 AM EDT -----  Please set up clinic appointment with me in 2-3 months. Please also ask him if he has scheduled his surgery yet. Thank You.

## 2017-05-09 NOTE — Telephone Encounter (Signed)
Pt was seen on 03/08/17 by Dr. Clyda Greener [facility]) of which a H&P for CCS Colectomy but nothing further about this. I left message for pt to contact office and inquire about this.

## 2017-05-22 NOTE — Telephone Encounter (Signed)
I spoke with pt and he has not had his surgery [facility]) for his colonoscopy. Pt states that he is not ready to do this at this time due to financial status.  Pt transferred to make f/u appt. To see Dr. Bonna Gains. I had also sent letter to Dr. Johney Maine asking if he had surgery. Pt is usually hard to reach.

## 2017-05-31 ENCOUNTER — Other Ambulatory Visit: Payer: Self-pay

## 2017-05-31 ENCOUNTER — Ambulatory Visit: Payer: Self-pay | Admitting: Gastroenterology

## 2017-05-31 ENCOUNTER — Encounter: Payer: Self-pay | Admitting: Gastroenterology

## 2017-05-31 VITALS — BP 144/80 | HR 70 | Temp 98.2°F | Ht 74.5 in | Wt 241.8 lb

## 2017-05-31 DIAGNOSIS — Z86010 Personal history of colonic polyps: Secondary | ICD-10-CM

## 2017-05-31 NOTE — Progress Notes (Addendum)
Vincent Antigua, MD [address]  [place]  [place], [place] [address]  Main: [phone]  Fax: [phone]   Primary Care Physician: [facility], [facility]  Primary Gastroenterologist:  Dr. Vonda Greene  Chief complaint: Follow-up of colonoscopy  HPI: Vincent MOTEN Sr. is a 57 y.o. male who was found to be FOBT positive and underwent colonoscopy in January 2019.  A 40 mm polyp in the sigmoid colon at 55 cm was multilobulated and could not be removed, as it did not lift completely with saline injection.Tattoo was placed  In close proximity.  Biopsy showed villous adenoma.  3 other smaller polyps were completely removed and 2 showed tubular adenoma, and another was no pathologic change.  Patient has seen Dr. Annie Main gross from general surgery at [facility], and surgery was planned.  However patient has not scheduled this due to financial issues.  Patient denies any blood or blood per rectum, no weight loss, no constipation or diarrhea.  No nausea or vomiting, dysphagia or heartburn.  Current Outpatient Medications  Medication Sig Dispense Refill  . amLODipine (NORVASC) 10 MG tablet Take 10 mg by mouth daily.    . furosemide (LASIX) 40 MG tablet Take 1 tablet (40 mg total) by mouth daily. (Patient not taking: Reported on 02/23/2017) 3 tablet 0  . meloxicam (MOBIC) 15 MG tablet Take 1 tablet (15 mg total) by mouth daily. (Patient not taking: Reported on 02/23/2017) 30 tablet 0   No current facility-administered medications for this visit.     Allergies as of 05/31/2017  . (No Known Allergies)    ROS:  General: Negative for anorexia, weight loss, fever, chills, fatigue, weakness. ENT: Negative for hoarseness, difficulty swallowing , nasal congestion. CV: Negative for chest pain, angina, palpitations, dyspnea on exertion, peripheral edema.  Respiratory: Negative for dyspnea at rest, dyspnea on exertion, cough, sputum, wheezing.  GI: See  history of present illness. GU:  Negative for dysuria, hematuria, urinary incontinence, urinary frequency, nocturnal urination.  Endo: Negative for unusual weight change.    Physical Examination: Vitals:   05/31/17 1314  BP: (!) 144/80  Pulse: 70  Temp: 98.2 F (36.8 C)  TempSrc: Oral  Weight: 241 lb 12.8 oz (109.7 kg)  Height: 6' 2.5" (1.892 m)     General: Well-nourished, well-developed in no acute distress.  Eyes: No icterus. Conjunctivae pink. Mouth: Oropharyngeal mucosa moist and pink , no lesions erythema or exudate. Neck: Supple, Trachea midline Abdomen: Bowel sounds are normal, nontender, nondistended, no hepatosplenomegaly or masses, no abdominal bruits or hernia , no rebound or guarding.   Extremities: No lower extremity edema. No clubbing or deformities. Neuro: Alert and oriented x 3.  Grossly intact. Skin: Warm and dry, no jaundice.   Psych: Alert and cooperative, normal mood and affect.   Labs: CMP     Component Value Date/Time   NA 139 06/06/2016 1109   NA 137 01/22/2014 1056   K 4.2 06/06/2016 1109   K 4.1 01/22/2014 1056   CL 103 06/06/2016 1109   CL 103 01/22/2014 1056   CO2 26 06/06/2016 1109   CO2 28 01/22/2014 1056   GLUCOSE 103 (H) 06/06/2016 1109   GLUCOSE 92 01/22/2014 1056   BUN 20 06/06/2016 1109   BUN 17 01/22/2014 1056   CREATININE 1.06 06/06/2016 1109   CREATININE 0.99 01/22/2014 1056   CALCIUM 8.9 06/06/2016 1109   CALCIUM 9.1 01/22/2014 1056   GFRNONAA >60 06/06/2016 1109   GFRNONAA >60 01/22/2014 1056  GFRAA >60 06/06/2016 1109   GFRAA >60 01/22/2014 1056   Lab Results  Component Value Date   WBC 9.8 06/06/2016   HGB 14.0 06/06/2016   HCT 41.6 06/06/2016   MCV 89.3 06/06/2016   PLT 221 06/06/2016    Imaging Studies: No results found.  Assessment and Plan:   Vincent HUNT Sr. is a 57 y.o. y/o male here for follow-up of large villous adenoma polyp in the sigmoid colon, that could not be removed via  colonoscopy  Patient will need surgery for resection of this area However, he is working on financial issues, before he can schedule this.  He anticipates scheduling this in the next 1 to 2 months. The risks of this turning into malignancy, or having a malignant area within the polyp, were discussed, and he was encouraged to schedule this sooner than later.  He understands the risks discussed.  I have also discussed, referral to tertiary care center, such as [facility] or [facility], to see if they are able to remove this large polyp endoscopically.  Patient is willing to proceed with this.  We will start the referral process.  No clinical symptoms of obstruction, or bleeding present at this time.  Patient was also given information about financial assistance program as well.  Dr Vincent Greene

## 2017-07-19 ENCOUNTER — Telehealth: Payer: Self-pay | Admitting: Gastroenterology

## 2017-07-19 NOTE — Telephone Encounter (Signed)
[facility] is calling in regards to pt to have Surgical plan please call [phone]

## 2017-07-31 ENCOUNTER — Ambulatory Visit: Payer: Self-pay | Admitting: Gastroenterology

## 2017-07-31 ENCOUNTER — Encounter: Payer: Self-pay | Admitting: Gastroenterology

## 2017-08-01 ENCOUNTER — Telehealth: Payer: Self-pay | Admitting: Gastroenterology

## 2017-08-01 NOTE — Telephone Encounter (Signed)
Joy called from [facility] regarding a referral you sent.  She hasn't been able to contact him and would like for you to call her. [phone]

## 2017-08-01 NOTE — Telephone Encounter (Signed)
Joy left another vm regarding trying to reach pt for referral to ask if he has coverage  They will close the referral if they can not reach out to pt please call [phone]

## 2017-08-02 NOTE — Telephone Encounter (Signed)
I spoke with Joy at [facility] regarding this pt's referral and they have tried to reach pt 3x and mail box is full. They are going to have to close the referral but can reopen if pt calls. This can be done for up to a year. I will contact [facility] in hopes he may call them and give them the number to give to pt to call [facility]. [phone]).

## 2017-08-02 NOTE — Telephone Encounter (Signed)
I called [facility] (PCP) regarding surgery plan and to ask if they do happen to speak to pt to have him contact [facility]. He has not been seen there since Feb. 2019. I also tried to reach pt via emergency contacts: V.[place]: [phone] A.[place]: [phone] And both numbers are not in service.

## 2017-08-11 ENCOUNTER — Telehealth: Payer: Self-pay | Admitting: Gastroenterology

## 2017-08-11 NOTE — Telephone Encounter (Signed)
Joy from [facility] is calling to let Dr. Bonna Gains know the referral for [facility] has been approved

## 2017-09-27 ENCOUNTER — Telehealth: Payer: Self-pay

## 2017-09-27 NOTE — Telephone Encounter (Signed)
I left pt a message to contact [place] regarding his flat polyp removal that has been open (this was faxed to [facility] on 07/11/2017). Previous phone calls to pt and emergency contacts (numbers are not valid), [facility] (in hopes they may talk with pt) in order to get pt scheduled. [facility] has made many attempts also to reach pt. See previous notes: 7/9, 7/10.

## 2017-09-27 NOTE — Telephone Encounter (Signed)
Pt returns my call and given number to contact [facility] and schedule his referral.

## 2017-09-27 NOTE — Telephone Encounter (Signed)
-----   Message from Virgel Manifold, MD sent at 09/14/2017  1:11 PM EDT ----- We got the message that his [facility] referral was approved. Has he scheduled an appointment with them yet?

## 2018-03-13 ENCOUNTER — Encounter: Payer: Self-pay | Admitting: Gastroenterology

## 2018-05-15 ENCOUNTER — Telehealth: Payer: Self-pay

## 2018-05-15 NOTE — Telephone Encounter (Signed)
[facility] Referral Update:  Patient was scheduled to have Colonoscopy with ESD with Dr. Harl Bowie at [facility] in March, however due to [place] 19-it had to be canceled- has not been rescheduled but it will be rescheduled. Update obtained from staff member named [place] on 05/14/18.   [place] Phone [phone]  Thanks Sharyn Lull

## 2018-06-25 ENCOUNTER — Encounter: Payer: Self-pay | Admitting: Gastroenterology

## 2018-06-27 DIAGNOSIS — D126 Benign neoplasm of colon, unspecified: Secondary | ICD-10-CM | POA: Insufficient documentation

## 2018-08-15 ENCOUNTER — Telehealth: Payer: Self-pay | Admitting: Gastroenterology

## 2018-08-15 ENCOUNTER — Encounter: Payer: Self-pay | Admitting: Gastroenterology

## 2018-08-15 NOTE — Telephone Encounter (Signed)
Left vm to schedule 3 month f/u apt °

## 2018-08-15 NOTE — Telephone Encounter (Signed)
-----   Message from Virgel Manifold, MD sent at 08/14/2018 12:59 PM EDT -----  Please set up clinic appointment with me in 3 months. Pt is getting his procedures and surgery evaluation at [facility]. This appointment in 3 months is to make sure he has received all the workup and care he needs in a timely fashion.

## 2018-09-11 ENCOUNTER — Other Ambulatory Visit: Payer: Self-pay

## 2018-09-11 ENCOUNTER — Encounter: Payer: Self-pay | Admitting: *Deleted

## 2018-09-11 ENCOUNTER — Ambulatory Visit (INDEPENDENT_AMBULATORY_CARE_PROVIDER_SITE_OTHER): Payer: Self-pay | Admitting: Gastroenterology

## 2018-09-11 DIAGNOSIS — Z86010 Personal history of colonic polyps: Secondary | ICD-10-CM

## 2018-09-19 ENCOUNTER — Encounter: Payer: Self-pay | Admitting: Gastroenterology

## 2018-09-25 ENCOUNTER — Other Ambulatory Visit: Payer: Self-pay

## 2018-09-26 ENCOUNTER — Encounter: Payer: Self-pay | Admitting: Gastroenterology

## 2018-09-26 ENCOUNTER — Telehealth: Payer: Self-pay | Admitting: Gastroenterology

## 2018-09-26 ENCOUNTER — Other Ambulatory Visit: Payer: Self-pay

## 2018-09-26 ENCOUNTER — Ambulatory Visit (INDEPENDENT_AMBULATORY_CARE_PROVIDER_SITE_OTHER): Payer: Self-pay | Admitting: Gastroenterology

## 2018-09-26 VITALS — BP 140/92 | HR 88 | Temp 98.2°F | Wt 245.2 lb

## 2018-09-26 DIAGNOSIS — Z860100 Personal history of colon polyps, unspecified: Secondary | ICD-10-CM

## 2018-09-26 DIAGNOSIS — K625 Hemorrhage of anus and rectum: Secondary | ICD-10-CM

## 2018-09-26 NOTE — Telephone Encounter (Signed)
Noted in chart.

## 2018-09-26 NOTE — Progress Notes (Signed)
Vonda Antigua, MD [address]  [place]  [place], [place] [address]  Main: [phone]  Fax: [phone]   Primary Care Physician: [facility]   Chief Complaint  Patient presents with  . Follow-up    HPI: Vincent KOGLIN Sr. is a 58 y.o. male here for follow-up of colon polyps.  He underwent colonoscopy at [facility] due to colonoscopy at [facility] done for FOBT positive showing large sigmoid colon polyp that could not be removed.  Colonoscopy at [facility] was done in January 2019 and he was referred to [facility], but failed to set up his appointment in a timely fashion despite our efforts and encouragement to do so.  [facility] colonoscopy done in May 2020 showed a 45 mm polyp in the sigmoid colon, which was attempted to be removed with ESD, but was unsuccessful.  Patient subsequently underwent surgery in June 2020 with laparoscopic left hemicolectomy with primary colocolonic anastomosis.  A. Sigmoid colon, partial colectomy: Colon with tubulovilous adenoma, 4.8 cm. No high grade dysplasia or invasive carcinoma is seen. 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 Eleven lymph nodes, negative for malignancy (0/11).   Patient reports doing well since his surgery.  He states his constipation issues have resolved since the surgery.  Is having 1-2 sometimes soft bowel movements a day, but sometimes they are hard.  Does report blood per rectum, in the toilet bowl itself, every other day that he states has increased since his surgery and was lesser prior to surgery.  No pain.  No weight loss.  No dysphagia.  No heartburn.    Current Outpatient Medications  Medication Sig Dispense Refill  . albuterol (VENTOLIN HFA) 108 (90 Base) MCG/ACT inhaler Inhale into the lungs.    Marland Kitchen amLODipine (NORVASC) 10 MG tablet Take 10 mg by mouth daily.    . naproxen (NAPROSYN) 500 MG tablet Take by mouth.    . furosemide (LASIX) 40 MG tablet Take 1  tablet (40 mg total) by mouth daily. 3 tablet 0  . gabapentin (NEURONTIN) 100 MG capsule Take by mouth.    . meloxicam (MOBIC) 15 MG tablet Take 1 tablet (15 mg total) by mouth daily. (Patient not taking: Reported on 02/23/2017) 30 tablet 0   No current facility-administered medications for this visit.     Allergies as of 09/26/2018  . (No Known Allergies)    ROS:  General: Negative for anorexia, weight loss, fever, chills, fatigue, weakness. ENT: Negative for hoarseness, difficulty swallowing , nasal congestion. CV: Negative for chest pain, angina, palpitations, dyspnea on exertion, peripheral edema.  Respiratory: Negative for dyspnea at rest, dyspnea on exertion, cough, sputum, wheezing.  GI: See history of present illness. GU:  Negative for dysuria, hematuria, urinary incontinence, urinary frequency, nocturnal urination.  Endo: Negative for unusual weight change.    Physical Examination:   BP (!) 140/92 (BP Location: Left Arm, Patient Position: Sitting, Cuff Size: Normal)   Pulse 88   Temp 98.2 F (36.8 C) (Oral)   Wt 245 lb 4 oz (111.2 kg)   BMI 31.07 kg/m   General: Well-nourished, well-developed in no acute distress.  Eyes: No icterus. Conjunctivae pink. Mouth: Oropharyngeal mucosa moist and pink , no lesions erythema or exudate. Neck: Supple, Trachea midline Abdomen: Bowel sounds are normal, nontender, nondistended, no hepatosplenomegaly or masses, no abdominal bruits or hernia , no rebound or guarding.   Extremities: No lower extremity edema. No clubbing or deformities. Neuro: Alert and  oriented x 3.  Grossly intact. Skin: Warm and dry, no jaundice.   Psych: Alert and cooperative, normal mood and affect.   Labs: CMP     Component Value Date/Time   NA 139 06/06/2016 1109   NA 137 01/22/2014 1056   K 4.2 06/06/2016 1109   K 4.1 01/22/2014 1056   CL 103 06/06/2016 1109   CL 103 01/22/2014 1056   CO2 26 06/06/2016 1109   CO2 28 01/22/2014 1056   GLUCOSE 103  (H) 06/06/2016 1109   GLUCOSE 92 01/22/2014 1056   BUN 20 06/06/2016 1109   BUN 17 01/22/2014 1056   CREATININE 1.06 06/06/2016 1109   CREATININE 0.99 01/22/2014 1056   CALCIUM 8.9 06/06/2016 1109   CALCIUM 9.1 01/22/2014 1056   GFRNONAA >60 06/06/2016 1109   GFRNONAA >60 01/22/2014 1056   GFRAA >60 06/06/2016 1109   GFRAA >60 01/22/2014 1056   Lab Results  Component Value Date   WBC 9.8 06/06/2016   HGB 14.0 06/06/2016   HCT 41.6 06/06/2016   MCV 89.3 06/06/2016   PLT 221 06/06/2016    Imaging Studies: No results found.  Assessment and Plan:   Vincent BIRCHARD Sr. is a 58 y.o. y/o male with left partial hemicolectomy due to unresectable sigmoid colon polyp, TVA here for follow-up  [facility] records personally reviewed  Patient did have internal hemorrhoids on his last colonoscopy Rectal bleeding intermittently could be due to internal hemorrhoids  Will repeat CBC and if normal, encourage high-fiber diet MiraLAX 2-3 times a week to maintain soft stool to see if it helps with his intermittent rectal bleeding as it may be due to hemorrhoids  If abnormal, patient may need colonoscopy to evaluate anastomosis site for bleeding  Patient will need a repeat colonoscopy in 1 year from his surgery   Dr Vonda Antigua

## 2018-09-26 NOTE — Telephone Encounter (Signed)
Please change pharmacy on file to [facility] on [address] thank you

## 2018-09-26 NOTE — Patient Instructions (Signed)
Please take Miralax daily till your follow up appointment

## 2018-09-27 ENCOUNTER — Ambulatory Visit: Payer: Self-pay | Admitting: Gastroenterology

## 2018-09-27 LAB — CBC
Hematocrit: 42.1 % (ref 37.5–51.0)
Hemoglobin: 13.7 g/dL (ref 13.0–17.7)
MCH: 29 pg (ref 26.6–33.0)
MCHC: 32.5 g/dL (ref 31.5–35.7)
MCV: 89 fL (ref 79–97)
Platelets: 284 10*3/uL (ref 150–450)
RBC: 4.73 x10E6/uL (ref 4.14–5.80)
RDW: 13.8 % (ref 11.6–15.4)
WBC: 13.5 10*3/uL — ABNORMAL HIGH (ref 3.4–10.8)

## 2018-10-10 ENCOUNTER — Telehealth: Payer: Self-pay

## 2018-10-10 NOTE — Telephone Encounter (Signed)
Patient verbalized understanding and states he is feeling better. He will do colonoscopy in 1 year

## 2018-10-10 NOTE — Telephone Encounter (Signed)
-----   Message from Virgel Manifold, MD sent at 10/10/2018 12:01 PM EDT ----- Caryl Pina please let the patient know, his blood work did not show any anemia.  If he is continuing to have blood per rectum, I would recommend a colonoscopy at this time.  If this has resolved, I would recommend a colonoscopy 1 year from his last surgery.  Please ensure there is a colonoscopy recall set in his chart 1 year from June 2020.  Please also set clinic appointment recall for 6 months if he does not already have one

## 2021-05-24 ENCOUNTER — Emergency Department: Payer: 59

## 2021-05-24 ENCOUNTER — Emergency Department
Admission: EM | Admit: 2021-05-24 | Discharge: 2021-05-24 | Disposition: A | Discharge status: Home / Self Care | Payer: 59 | Attending: Emergency Medicine | Admitting: Emergency Medicine

## 2021-05-24 ENCOUNTER — Other Ambulatory Visit: Payer: Self-pay

## 2021-05-24 DIAGNOSIS — I10 Essential (primary) hypertension: Secondary | ICD-10-CM | POA: Diagnosis not present

## 2021-05-24 DIAGNOSIS — M1990 Unspecified osteoarthritis, unspecified site: Secondary | ICD-10-CM | POA: Insufficient documentation

## 2021-05-24 DIAGNOSIS — M25562 Pain in left knee: Secondary | ICD-10-CM | POA: Diagnosis present

## 2021-05-24 HISTORY — DX: Hyperlipidemia, unspecified: E78.5

## 2021-05-24 MED ORDER — MELOXICAM 15 MG PO TABS: 15.0000 mg | ORAL_TABLET | Freq: Every day | ORAL | 0 refills | Status: AC

## 2021-05-24 NOTE — ED Provider Notes (Signed)
? ?[facility] ?Provider Note ? ? ? Event Date/Time  ? First MD Initiated Contact with Patient 05/24/21 1105   ?  (approximate) ? ?History  ? ?Chief Complaint: Knee Pain ? ?HPI ? ?Vincent Andreoni Hubbard Sr. is a 61 y.o. male with a past medical history of hypertension, hyperlipidemia presents emergency department for left knee pain.  According to the patient he has been experiencing intermittent pain in the left knee times months, worse over the past couple days.  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  Patient states he has noticed some mild swelling to the knee.  Patient has good range of motion in the knee, no fever.  No history of gout.  No trauma. ? ?Physical Exam  ? ?Triage Vital Signs: ?ED Triage Vitals  ?Enc Vitals Group  ?   BP 05/24/21 1048 (!) 161/108  ?   Pulse Rate 05/24/21 1048 76  ?   Resp 05/24/21 1048 17  ?   Temp 05/24/21 1048 98.5 ?F (36.9 ?C)  ?   Temp Source 05/24/21 1048 Oral  ?   SpO2 05/24/21 1048 98 %  ?   Weight 05/24/21 1049 250 lb (113.4 kg)  ?   Height 05/24/21 1049 '6\' 3"'$  (1.905 m)  ?   Head Circumference --   ?   Peak Flow --   ?   Pain Score 05/24/21 1049 8  ?   Pain Loc --   ?   Pain Edu? --   ?   Excl. in [place]? --   ? ? ?Most recent vital signs: ?Vitals:  ? 05/24/21 1048  ?BP: (!) 161/108  ?Pulse: 76  ?Resp: 17  ?Temp: 98.5 ?F (36.9 ?C)  ?SpO2: 98%  ? ? ?General: Awake, no distress.  ?CV:  Good peripheral perfusion.  Regular rate and rhythm  ?Resp:  Normal effort.  Equal breath sounds bilaterally.  ?Abd:  No distention.  Soft, nontender.  No rebound or guarding. ?Other:  No edema noted in the left knee, no warmth.  Good range of motion.  No obvious effusion on my examination, neurovascular intact distally. ? ? ?ED Results / Procedures / Treatments  ? ? ?RADIOLOGY ? ?I reviewed the knee x-ray images no acute finding my evaluation. ?Radiology is read mild arthritis with small joint  effusion. ? ? ?MEDICATIONS ORDERED IN ED: ?Medications - No data to display ? ? ?IMPRESSION / MDM / ASSESSMENT AND PLAN / ED COURSE  ?I reviewed the triage vital signs and the nursing notes. ? ?Patient presents to the emergency department for left knee pain intermittent times months to years but more significant over the past several days.  Patient symptoms seem very much consistent with overuse or osteoarthritis.  Differential would also include ligamentous or cartilaginous injury.  Less likely gout given no fever, no warmth and good range of motion.  No concern for septic joint.  We will obtain an x-ray to evaluate for any bony abnormality, arthritis, etc.  We will likely place the patient on meloxicam have him follow-up with orthopedics. ? ?Mild arthritis small joint effusion we will discharge with meloxicam have the patient follow-up with orthopedics.  Patient agreeable to plan of care. ? ?FINAL CLINICAL IMPRESSION(S) / ED DIAGNOSES  ? ?Left knee pain ? ?Rx / DC Orders  ? ?Meloxicam ?Orthopedic follow-up ? ?Note:  This document was prepared using Dragon  voice recognition software and may include unintentional dictation errors. ?  ?Harvest Dark, MD ?05/24/21 1300 ? ?

## 2021-05-24 NOTE — ED Triage Notes (Signed)
Pt c/o left knee pain with swelling since Tuesday, denies injury.  ?

## 2022-12-11 IMAGING — CR DG KNEE COMPLETE 4+V*L*
1 series · 4 of 4 positions shown · non-contrast
Comparison: None.

CLINICAL DATA: Knee pain

EXAM:
LEFT KNEE - COMPLETE 4+ VIEW

[Series 1: dg knee complete 4 views left · 0.14mm/px · 4 of 4 slices shown]
[im 1/4]
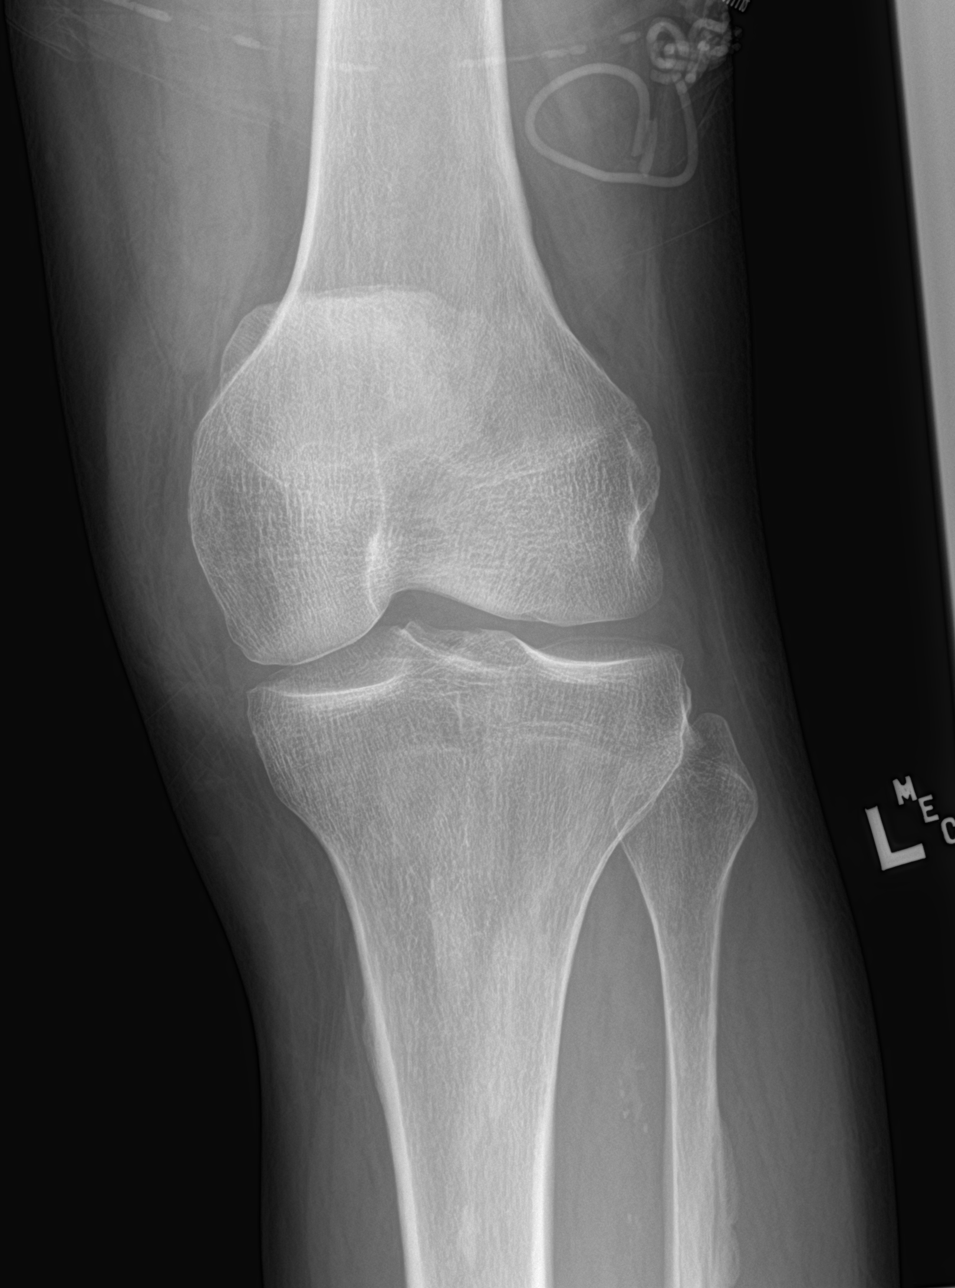
[im 2/4]
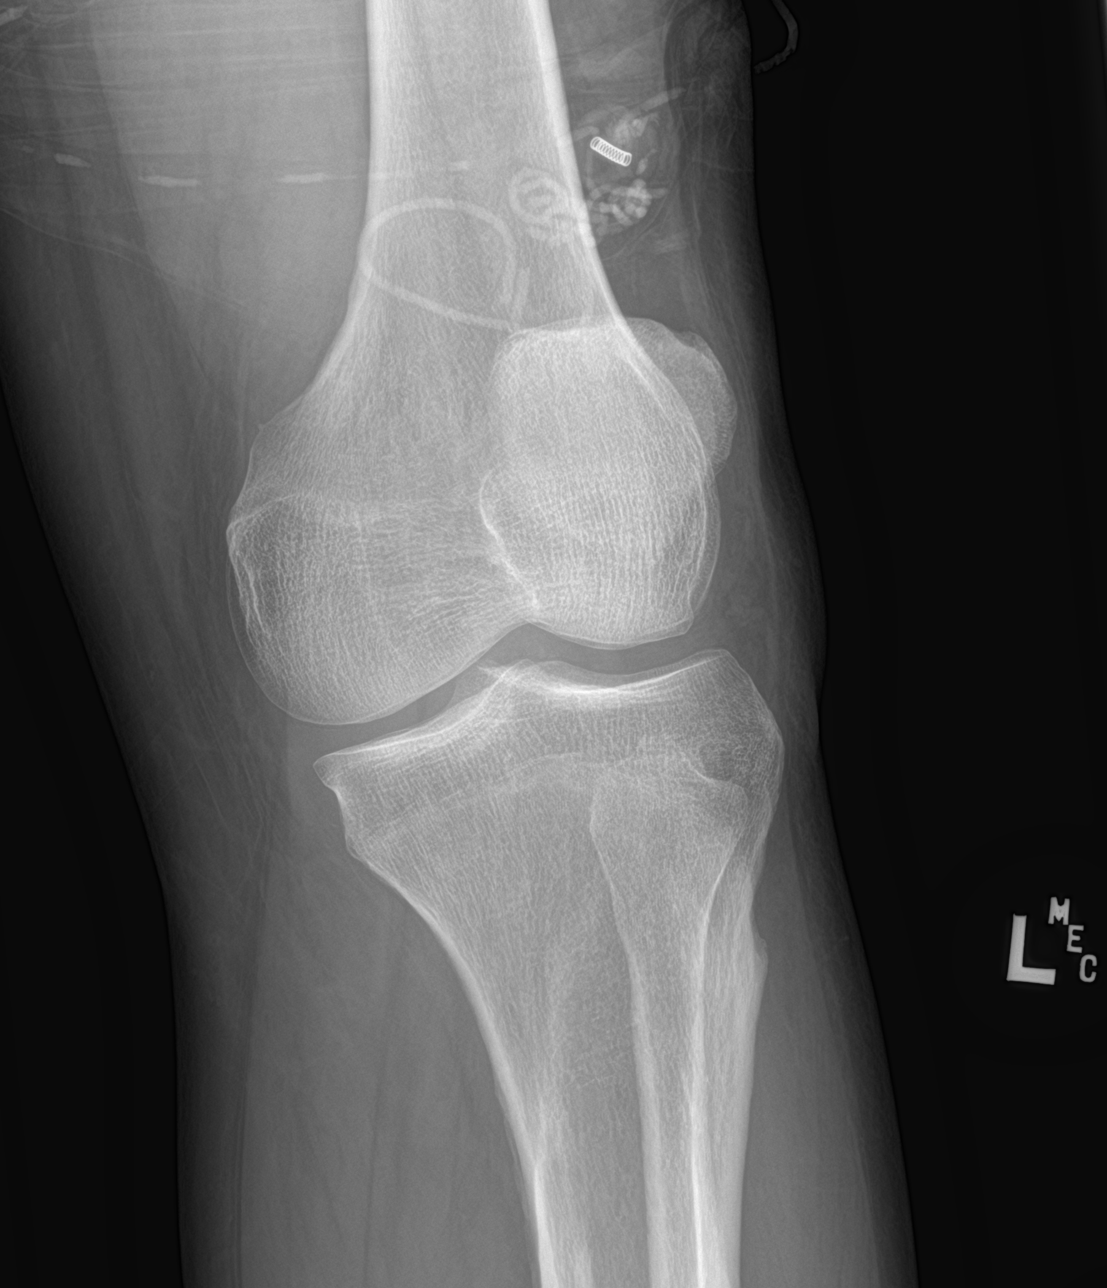
[im 3/4]
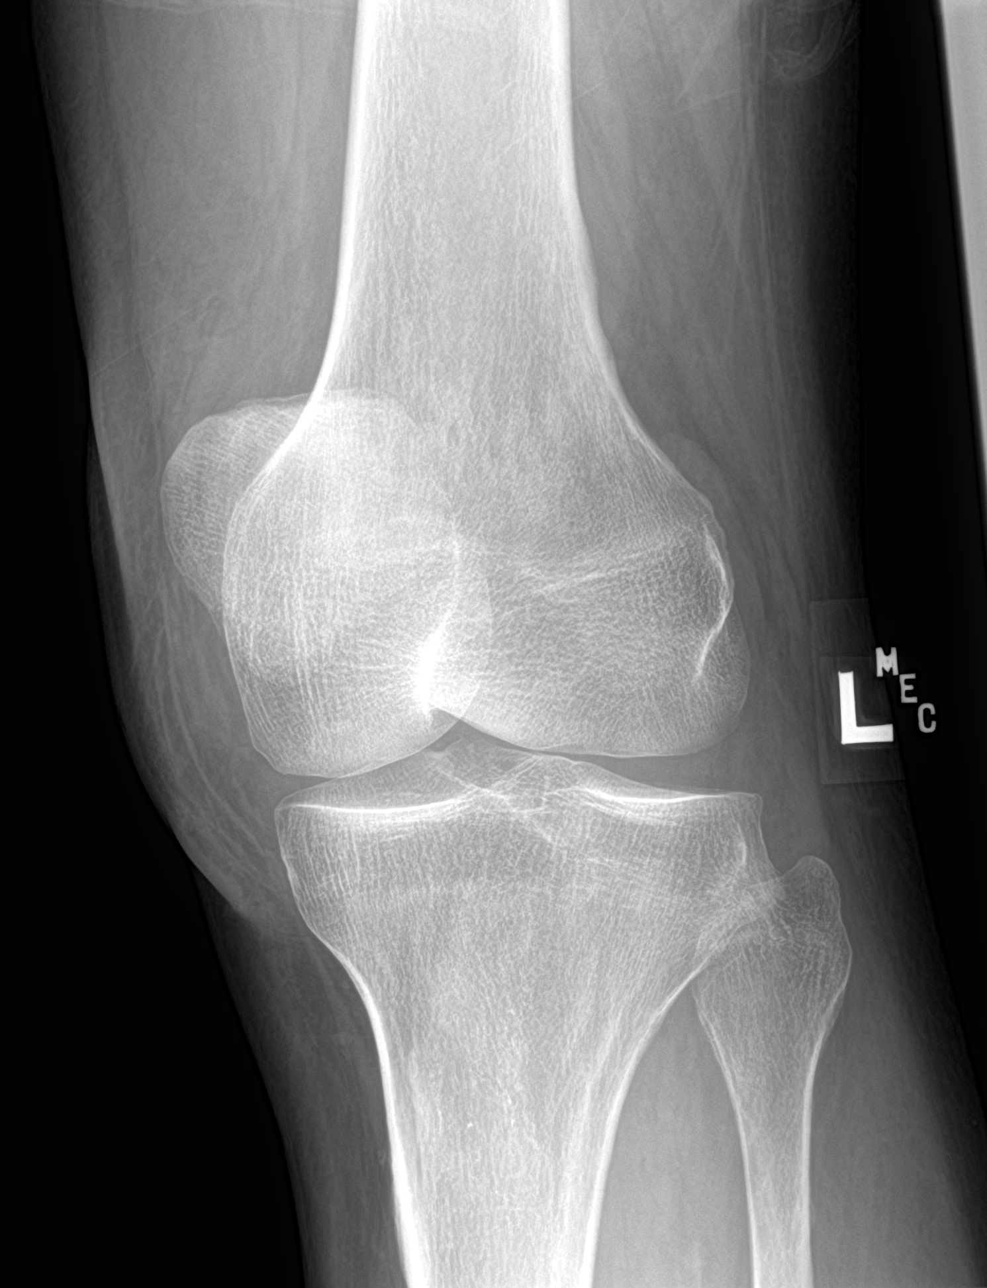
[im 4/4]
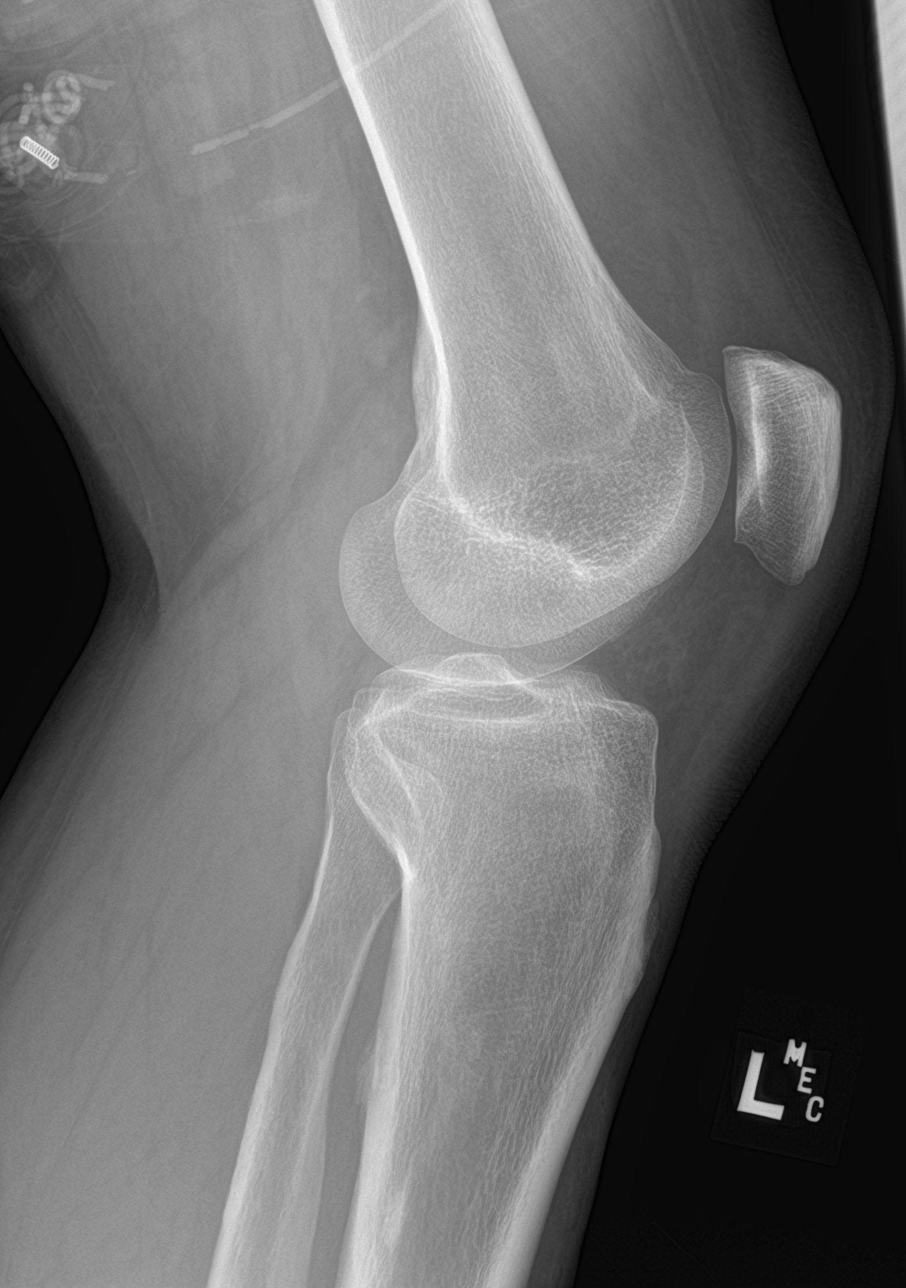

[4 of 4 positions shown; findings below may reference images not displayed]

FINDINGS: There is no evidence of acute fracture. There is a small joint
effusion. There is tricompartment osteophyte formation with mild
medial joint space narrowing.
IMPRESSION: No acute osseous abnormality.  Small joint effusion.

Mild tricompartment osteoarthritis.

## 2024-02-12 ENCOUNTER — Emergency Department
Admission: EM | Admit: 2024-02-12 | Discharge: 2024-02-12 | Disposition: A | Discharge status: Home / Self Care | Attending: Emergency Medicine | Admitting: Emergency Medicine

## 2024-02-12 ENCOUNTER — Other Ambulatory Visit: Payer: Self-pay

## 2024-02-12 DIAGNOSIS — I10 Essential (primary) hypertension: Secondary | ICD-10-CM | POA: Diagnosis not present

## 2024-02-12 DIAGNOSIS — T148XXA Other injury of unspecified body region, initial encounter: Secondary | ICD-10-CM

## 2024-02-12 DIAGNOSIS — X58XXXA Exposure to other specified factors, initial encounter: Secondary | ICD-10-CM | POA: Insufficient documentation

## 2024-02-12 DIAGNOSIS — S199XXA Unspecified injury of neck, initial encounter: Secondary | ICD-10-CM | POA: Diagnosis present

## 2024-02-12 DIAGNOSIS — S161XXA Strain of muscle, fascia and tendon at neck level, initial encounter: Secondary | ICD-10-CM | POA: Diagnosis not present

## 2024-02-12 MED ORDER — CYCLOBENZAPRINE HCL 5 MG PO TABS: 5.0000 mg | ORAL_TABLET | Freq: Three times a day (TID) | ORAL | 0 refills | Status: AC | PRN

## 2024-02-12 MED ORDER — CYCLOBENZAPRINE HCL 10 MG PO TABS
5.0000 mg | ORAL_TABLET | Freq: Once | ORAL | Status: AC
  Administered 2024-02-12: 5 mg via ORAL
  Filled 2024-02-12: qty 1

## 2024-02-12 NOTE — ED Triage Notes (Addendum)
 Pt comes with left sided neck pain that radiates down her shoulder and arm. Pt states it just started this morning when he woke up. Pt denies any injury or heavy lifting. Pt denies any cp or sob.

## 2024-02-12 NOTE — Discharge Instructions (Addendum)
 Please take your muscle relaxer as prescribed as needed.  You may use this in addition to Tylenol  or ibuprofen as written on the box.  Please follow-up with your doctor should the pain continue.  You may find some relief using a warm compress or heating pad to the area for 20 to 30 minutes every 2-3 hours.  Return to the emergency department for any worsening pain or any other symptom per se concerning to yourself.

## 2024-02-12 NOTE — ED Provider Notes (Signed)
 "  [facility] Provider Note    Event Date/Time   First MD Initiated Contact with Patient 02/12/24 [phone]     (approximate)  History   Chief Complaint: Neck Injury  HPI  Vincent ESCHER Sr. is a 64 y.o. male with a past [place] history of hypertension, hyperlipidemia, presents to the emergency department for left-sided neck/shoulder pain.  According to the patient over the last 2 days he has been experiencing pain in the left side of his neck and shoulder.  Patient states the pain is worse if he moves his shoulder or turns his head.  Patient denies any injuries denies any trauma.  Patient states this started after sleeping on a Friday night.  He thinks he may have slept on his neck wrong.  Pain does radiate somewhat into the left upper chest although is very reproducible with palpation or movement.  Denies any shortness of breath.  Physical Exam   Triage Vital Signs: ED Triage Vitals  Encounter Vitals Group     BP 02/12/24 0933 (!) 165/101     Girls Systolic BP Percentile --      Girls Diastolic BP Percentile --      Boys Systolic BP Percentile --      Boys Diastolic BP Percentile --      Pulse Rate 02/12/24 0933 78     Resp 02/12/24 0933 18     Temp 02/12/24 0933 98.3 F (36.8 C)     Temp src --      SpO2 02/12/24 0933 99 %     Weight 02/12/24 0931 245 lb (111.1 kg)     Height 02/12/24 0931 6' 2 (1.88 m)     Head Circumference --      Peak Flow --      Pain Score 02/12/24 0931 8     Pain Loc --      Pain Education --      Exclude from Growth Chart --     Most recent vital signs: Vitals:   02/12/24 0933  BP: (!) 165/101  Pulse: 78  Resp: 18  Temp: 98.3 F (36.8 C)  SpO2: 99%    General: Awake, no distress.  CV:  Good peripheral perfusion.  Regular rate and rhythm  Resp:  Normal effort.  Equal breath sounds bilaterally.  Abd:  No distention.   Other:  Moderate tenderness to palpation along the left trapezius into the left shoulder as well as  a left deltoid/left upper chest.  Minimal tenderness with range of motion of the shoulder.  States some pain with range of motion of the neck.  No midline cervical tenderness.   ED Results / Procedures / Treatments   EKG  EKG viewed and interpreted by myself shows a normal sinus rhythm at 70 bpm with a narrow QRS, normal axis, normal intervals, no concerning ST changes.  MEDICATIONS ORDERED IN ED: Medications  cyclobenzaprine  (FLEXERIL ) tablet 5 mg (has no administration in time range)     IMPRESSION / MDM / ASSESSMENT AND PLAN / ED COURSE  I reviewed the triage vital signs and the nursing notes.  Patient's presentation is most consistent with acute presentation with potential threat to life or bodily function.  Patient presents to the emergency department for left-sided neck pain radiating into the left shoulder.  Symptoms seem much more suggestive of muscular pain symptoms are very reproducible with palpation.  Will obtain an EKG as a precaution.  We will dose Flexeril  in addition  to Tylenol /ibuprofen to be used at home.  Patient agreeable to plan.  EKG reassuring.  Will discharge with Flexeril  and PCP follow-up.  FINAL CLINICAL IMPRESSION(S) / ED DIAGNOSES   Muscular strain Cervical strain   Note:  This document was prepared using Dragon voice recognition software and may include unintentional dictation errors.   Dorothyann Drivers, MD 02/12/24 [phone]  "

## 2024-02-12 NOTE — ED Notes (Signed)
 Pt states movement makes neck pain worse
